# Patient Record
Sex: Female | Born: 1960 | Race: White | Hispanic: No | Marital: Married | Smoking: Former smoker
Health system: Southern US, Community
[De-identification: ages and names within clinical notes are randomized; demographics above are authoritative.]

## PROBLEM LIST (undated history)

## (undated) DIAGNOSIS — F329 Major depressive disorder, single episode, unspecified: Secondary | ICD-10-CM

## (undated) DIAGNOSIS — E039 Hypothyroidism, unspecified: Secondary | ICD-10-CM

## (undated) DIAGNOSIS — F32A Depression, unspecified: Secondary | ICD-10-CM

## (undated) DIAGNOSIS — N393 Stress incontinence (female) (male): Secondary | ICD-10-CM

## (undated) DIAGNOSIS — E079 Disorder of thyroid, unspecified: Secondary | ICD-10-CM

## (undated) HISTORY — PX: COLONOSCOPY: SHX174

## (undated) HISTORY — DX: Disorder of thyroid, unspecified: E07.9

---

## 1987-08-09 HISTORY — PX: THYROIDECTOMY: SHX17

## 2004-06-05 ENCOUNTER — Ambulatory Visit: Payer: Self-pay

## 2004-06-14 ENCOUNTER — Ambulatory Visit: Payer: Self-pay | Admitting: Obstetrics and Gynecology

## 2004-07-14 ENCOUNTER — Ambulatory Visit: Payer: Self-pay | Admitting: Pain Medicine

## 2004-07-28 ENCOUNTER — Ambulatory Visit: Payer: Self-pay | Admitting: Pain Medicine

## 2004-09-02 ENCOUNTER — Ambulatory Visit: Payer: Self-pay | Admitting: Pain Medicine

## 2004-09-13 ENCOUNTER — Ambulatory Visit: Payer: Self-pay | Admitting: Pain Medicine

## 2004-10-19 ENCOUNTER — Ambulatory Visit: Payer: Self-pay | Admitting: Pain Medicine

## 2005-05-25 ENCOUNTER — Encounter: Payer: Self-pay | Admitting: Obstetrics and Gynecology

## 2005-06-08 ENCOUNTER — Encounter: Payer: Self-pay | Admitting: Obstetrics and Gynecology

## 2005-07-08 ENCOUNTER — Encounter: Payer: Self-pay | Admitting: Obstetrics and Gynecology

## 2005-07-12 ENCOUNTER — Ambulatory Visit: Payer: Self-pay | Admitting: Obstetrics and Gynecology

## 2005-07-21 ENCOUNTER — Ambulatory Visit: Payer: Self-pay | Admitting: Obstetrics and Gynecology

## 2006-07-12 ENCOUNTER — Ambulatory Visit: Payer: Self-pay | Admitting: Obstetrics and Gynecology

## 2007-07-30 ENCOUNTER — Ambulatory Visit: Payer: Self-pay | Admitting: Obstetrics and Gynecology

## 2008-08-05 ENCOUNTER — Ambulatory Visit: Payer: Self-pay | Admitting: Obstetrics and Gynecology

## 2009-08-06 ENCOUNTER — Ambulatory Visit: Payer: Self-pay | Admitting: Obstetrics and Gynecology

## 2010-08-16 ENCOUNTER — Ambulatory Visit: Payer: Self-pay | Admitting: Obstetrics and Gynecology

## 2010-08-20 ENCOUNTER — Ambulatory Visit: Payer: Self-pay | Admitting: Obstetrics and Gynecology

## 2011-10-04 ENCOUNTER — Ambulatory Visit: Payer: Self-pay | Admitting: Obstetrics and Gynecology

## 2012-10-04 ENCOUNTER — Ambulatory Visit: Payer: Self-pay | Admitting: Obstetrics and Gynecology

## 2013-07-15 ENCOUNTER — Ambulatory Visit: Payer: Self-pay | Admitting: Unknown Physician Specialty

## 2013-07-16 LAB — PATHOLOGY REPORT

## 2013-10-07 ENCOUNTER — Ambulatory Visit: Payer: Self-pay | Admitting: Obstetrics and Gynecology

## 2014-09-13 ENCOUNTER — Ambulatory Visit: Payer: Self-pay | Admitting: Internal Medicine

## 2014-09-13 LAB — RAPID STREP-A WITH REFLX: MICRO TEXT REPORT: NEGATIVE

## 2014-09-15 LAB — BETA STREP CULTURE(ARMC)

## 2015-08-06 ENCOUNTER — Other Ambulatory Visit: Payer: Self-pay | Admitting: Obstetrics and Gynecology

## 2015-08-06 DIAGNOSIS — Z1231 Encounter for screening mammogram for malignant neoplasm of breast: Secondary | ICD-10-CM

## 2015-08-07 ENCOUNTER — Ambulatory Visit
Admission: RE | Admit: 2015-08-07 | Discharge: 2015-08-07 | Disposition: A | Discharge status: Home / Self Care | Payer: 59 | Source: Ambulatory Visit | Attending: Obstetrics and Gynecology | Admitting: Obstetrics and Gynecology

## 2015-08-07 DIAGNOSIS — Z1231 Encounter for screening mammogram for malignant neoplasm of breast: Secondary | ICD-10-CM | POA: Diagnosis present

## 2016-02-01 IMAGING — MG MM DIGITAL SCREENING BILAT W/ CAD
1 series · 4 of 4 positions shown · non-contrast
Comparison: Previous exam(s).

CLINICAL DATA: Screening.

EXAM:
DIGITAL SCREENING BILATERAL MAMMOGRAM WITH CAD

[R CC · right · 4 of 4 slices shown]
[im 1/4]
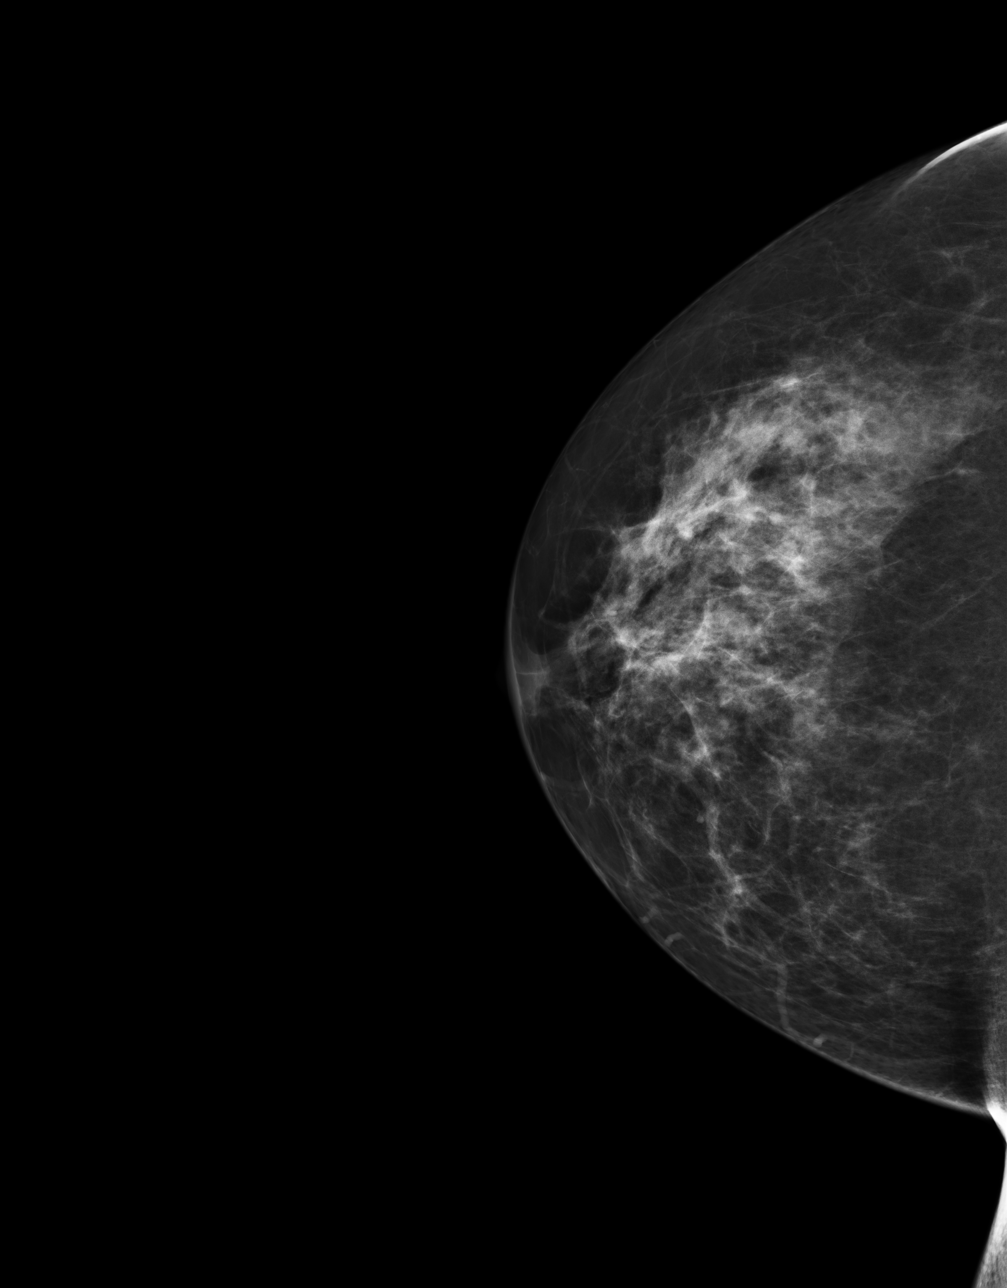
[im 2/4]
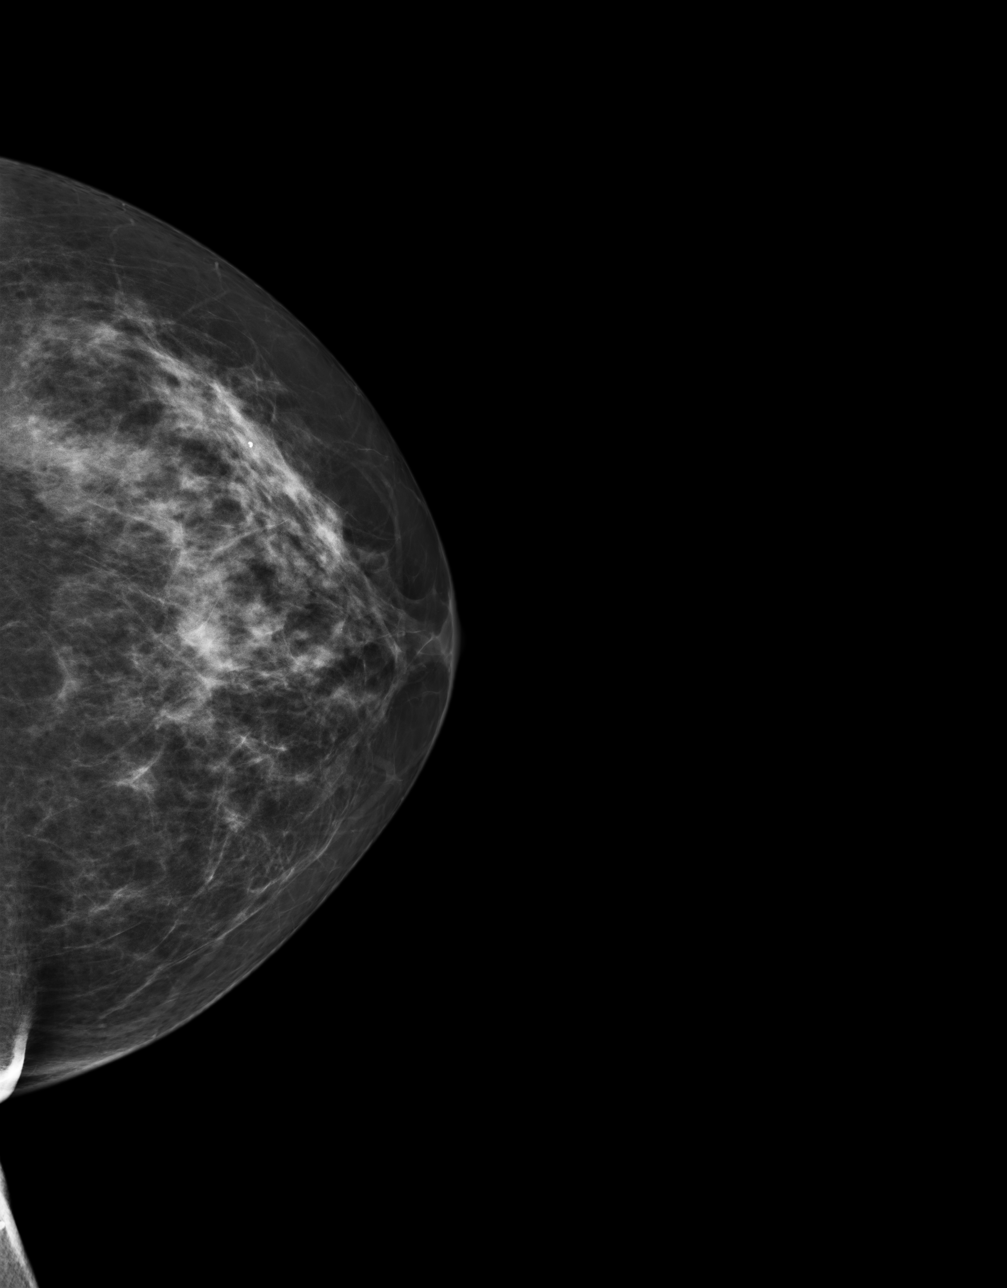
[im 3/4]
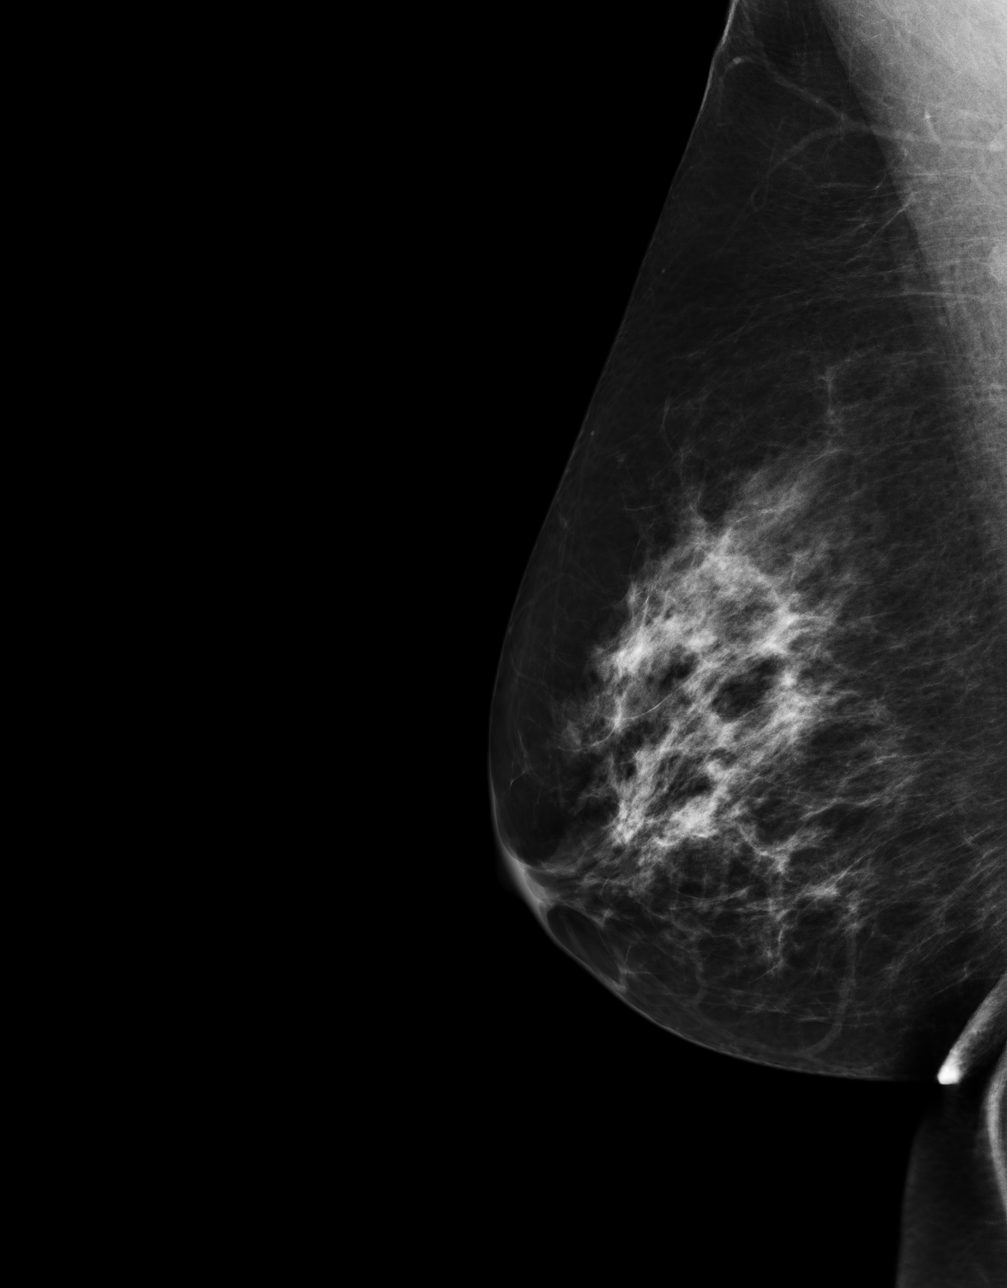
[im 4/4]
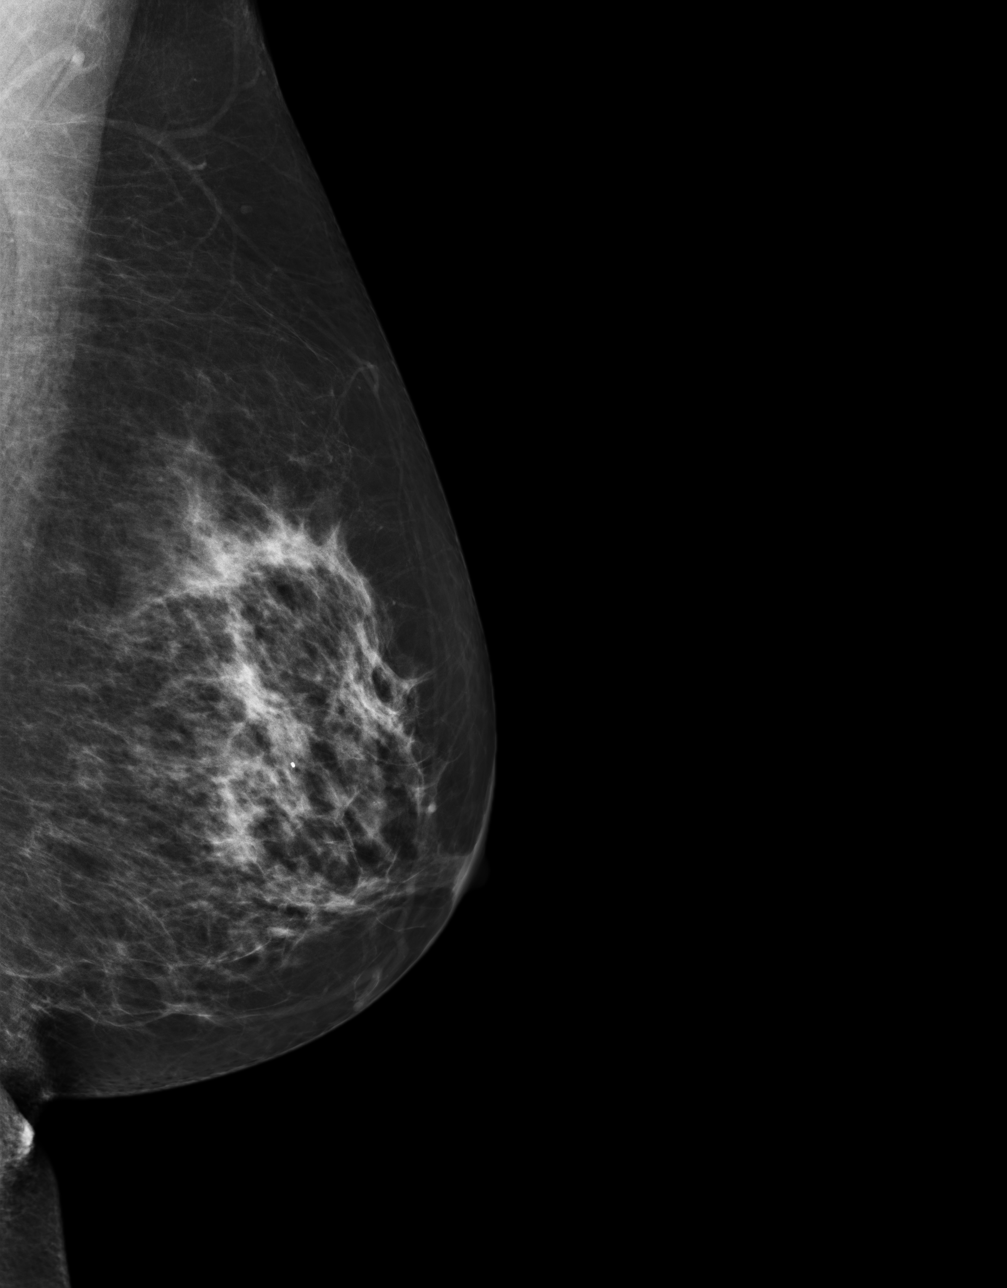

[4 of 4 positions shown; findings below may reference images not displayed]

ACR Breast Density Category c: The breast tissue is heterogeneously
dense, which may obscure small masses.
FINDINGS: There are no findings suspicious for malignancy. Images were
processed with CAD.
IMPRESSION: No mammographic evidence of malignancy. A result letter of this
screening mammogram will be mailed directly to the patient.

RECOMMENDATION:
Screening mammogram in one year. (Code:[id number])

BI-RADS CATEGORY  1: Negative.

## 2017-01-31 NOTE — Progress Notes (Addendum)
02/01/2017 9:50 AM   Abigail West 04/03/1961 [id number]  Referring provider: Schermerhorn, Abigail Austinhomas J, MD [address] [facility] [place], [place] [address]  Chief Complaint  Patient presents with  . Urinary Incontinence    HPI: Patient is a 56 -year-old Caucasian female who is referred to Abigail West by, Abigail West, for urinary incontinence.  Patient states that she has had urinary incontinence for two years.  Patient has incontinence with stress.  She is having urgency, but she denies urge incontinence.  She is experiencing 2 to 3 incontinent episodes during the day. She is experiencing 2 to 3 incontinent episodes during the night.     Her incontinence volume is moderate.   She is wearing 1 to 2 pads/depends daily.    She is having associated urinary frequency x every 30 minutes and nocturia x 2-3.   She does not have a history of urinary tract infections, STI's or injury to the bladder.   She denies dysuria, gross hematuria, suprapubic pain, back pain, abdominal pain or flank pain.  She has not had any recent fevers, chills, nausea or vomiting.   She does not have a history of nephrolithiasis, GU surgery or GU trauma.  She is sexually active.  She has noted incontinence with sexual intercourse.    She is post menopausal.   She denies constipation and/or diarrhea.   She is not having pain with bladder filling.    She has not had any recent imaging studies.    She is drinking a lot of water daily.   She is drinking 3 cups of caffeinated beverages daily.  She is not drinking  alcoholic beverages daily.    Her risk factors for incontinence are a family history of incontinence, age, caffeine, depression and vaginal atrophy.    She is taking antidepressants.      Her UA was unremarkable.  PVR was 0 mL.    PMH: Past Medical History:  Diagnosis Date  . Thyroid disease     Surgical History: Past Surgical History:  Procedure Laterality Date    . THYROIDECTOMY  1989    Home Medications:  Allergies as of 02/01/2017   No Known Allergies     Medication List       Accurate as of 02/01/17 11:59 PM. Always use your most recent med list.          FLUoxetine 20 MG capsule Commonly known as:  PROZAC Take 20 mg by mouth daily.   levothyroxine 88 MCG tablet Commonly known as:  SYNTHROID, LEVOTHROID Take 88 mcg by mouth daily.       Allergies: No Known Allergies  Family History: No family history on file.  Social History:  reports that she quit smoking about 4 years ago. Her smoking use included Cigarettes. She has never used smokeless tobacco. She reports that she drinks about 1.8 oz of alcohol per week . She reports that she does not use drugs.  ROS: UROLOGY Frequent Urination?: Yes Hard to postpone urination?: Yes Burning/pain with urination?: No Get up at night to urinate?: Yes Leakage of urine?: Yes Urine stream starts and stops?: Yes Trouble starting stream?: No Do you have to strain to urinate?: No Blood in urine?: No Urinary tract infection?: No Sexually transmitted disease?: No Injury to kidneys or bladder?: No Painful intercourse?: Yes Weak stream?: No Currently pregnant?: No Vaginal bleeding?: No Last menstrual period?: n  Gastrointestinal Nausea?: No Vomiting?: No Indigestion/heartburn?: No Diarrhea?: No Constipation?:  No  Constitutional Fever: No Night sweats?: No Weight loss?: No Fatigue?: No  Skin Skin rash/lesions?: No Itching?: Yes  Eyes Blurred vision?: No Double vision?: No  Ears/Nose/Throat Sore throat?: No Sinus problems?: No  Hematologic/Lymphatic Swollen glands?: No Easy bruising?: No  Cardiovascular Leg swelling?: No Chest pain?: No  Respiratory Cough?: No Shortness of breath?: No  Endocrine Excessive thirst?: No  Musculoskeletal Back pain?: No Joint pain?: No  Neurological Headaches?: No Dizziness?: No  Psychologic Depression?: No Anxiety?:  No  Physical Exam: BP 111/72   Pulse 71   Ht 5\' 4"  (1.626 m)   Wt 174 lb (78.9 kg)   BMI 29.87 kg/m   Constitutional: Well nourished. Alert and oriented, No acute distress. HEENT: [place] AT, moist mucus membranes. Trachea midline, no masses. Cardiovascular: No clubbing, cyanosis, or edema. Respiratory: Normal respiratory effort, no increased work of breathing. GI: Abdomen is soft, non tender, non distended, no abdominal masses. Liver and spleen not palpable.  No hernias appreciated.  Stool sample for occult testing is not indicated.   GU: No CVA tenderness.  No bladder fullness or masses.  Atrophic external genitalia, normal pubic hair distribution, no lesions.  Normal urethral meatus, no lesions, no prolapse, no discharge.   No urethral masses, tenderness and/or tenderness. No bladder fullness, tenderness or masses. Pale vagina mucosa, poor estrogen effect, no discharge, no lesions, good pelvic support, Grade II cystocele.  No rectocele noted.  No cervical motion tenderness.  Uterus is freely mobile and non-fixed.  No adnexal/parametria masses or tenderness noted.  Anus and perineum are without rashes or lesions.    Skin: No rashes, bruises or suspicious lesions. Lymph: No cervical or inguinal adenopathy. Neurologic: Grossly intact, no focal deficits, moving all 4 extremities. Psychiatric: Normal mood and affect.   Pertinent Imaging: Results for Abigail West, Abigail West (MRN [id number]) as of 02/02/2017 14:22  Ref. Range 02/01/2017 15:33  Scan Result Unknown 0    Assessment & Plan:    1. Stress Incontinence  - offered behavioral therapies, bladder training and bladder control strategies - patient would prefer a more definitive treatment plan  - pelvic floor muscle training - patient deferred  - fluid management - reduce caffeine   - offered refer to gynecology for a pessary fitting - patient deferred  - offered an appointment with one of our surgeon for a possible pelvic sling procedure -  schedule an appointment with Dr. Sherron Monday - patient advised that there is an extensive work up to see if she is a suitable candidate for a sling   2. Urgency  - offered medical therapy with anticholinergic therapy or beta-3 adrenergic receptor agonist and the potential side effects of each therapy   - would like to try the beta-3 adrenergic receptor agonist (Myrbetriq).  Given Myrbetriq 25 mg samples, #28.  I have reviewed with the patient of the side effects of Myrbetriq, such as: elevation in BP, urinary retention and/or HA.    - She will return for an appointment with Dr. Sherron Monday  3. Vaginal atrophy  - Patient was given a sample of vaginal estrogen cream (Premarin) and instructed to apply 0.5mg  (pea-sized amount)  just inside the vaginal introitus with a finger-tip every night for two weeks and then Monday, Wednesday and Friday nights.  I explained to the patient that vaginally administered estrogen, which causes only a slight increase in the blood estrogen levels, have fewer contraindications and adverse systemic effects that oral HT.   Return for appointment with Dr. Sherron Monday.  These notes generated with voice recognition software. I apologize for typographical errors.  Zara Council, [facility] [address], [place], [place] [address] [phone]

## 2017-02-01 ENCOUNTER — Encounter: Payer: Self-pay | Admitting: Urology

## 2017-02-01 ENCOUNTER — Ambulatory Visit (INDEPENDENT_AMBULATORY_CARE_PROVIDER_SITE_OTHER): Payer: 59 | Admitting: Urology

## 2017-02-01 VITALS — BP 111/72 | HR 71 | Ht 64.0 in | Wt 174.0 lb

## 2017-02-01 DIAGNOSIS — N393 Stress incontinence (female) (male): Secondary | ICD-10-CM

## 2017-02-01 DIAGNOSIS — R3915 Urgency of urination: Secondary | ICD-10-CM | POA: Diagnosis not present

## 2017-02-01 DIAGNOSIS — N952 Postmenopausal atrophic vaginitis: Secondary | ICD-10-CM | POA: Diagnosis not present

## 2017-02-01 LAB — BLADDER SCAN AMB NON-IMAGING: Scan Result: 0

## 2017-02-02 LAB — URINALYSIS, COMPLETE
BILIRUBIN UA: NEGATIVE
GLUCOSE, UA: NEGATIVE
KETONES UA: NEGATIVE
Leukocytes, UA: NEGATIVE
Nitrite, UA: NEGATIVE
Protein, UA: NEGATIVE
RBC, UA: NEGATIVE
Specific Gravity, UA: 1.02 (ref 1.005–1.030)
UUROB: 0.2 mg/dL (ref 0.2–1.0)
pH, UA: 6.5 (ref 5.0–7.5)

## 2017-03-01 ENCOUNTER — Other Ambulatory Visit: Payer: Self-pay | Admitting: Family Medicine

## 2017-03-01 DIAGNOSIS — Z1231 Encounter for screening mammogram for malignant neoplasm of breast: Secondary | ICD-10-CM

## 2017-03-06 ENCOUNTER — Ambulatory Visit: Payer: 59 | Admitting: Urology

## 2017-03-06 ENCOUNTER — Encounter: Payer: Self-pay | Admitting: Urology

## 2017-03-06 VITALS — BP 142/76 | HR 62 | Ht 64.0 in | Wt 178.8 lb

## 2017-03-06 DIAGNOSIS — N3946 Mixed incontinence: Secondary | ICD-10-CM

## 2017-03-06 NOTE — Progress Notes (Signed)
03/06/2017 4:09 PM   Abigail West 01/28/1961 [id number]  Referring provider: Schermerhorn, Ihor Austinhomas J, MD [address] [facility] [place], [place] [id number]  Chief Complaint  Patient presents with  . Urinary Incontinence    HPI: Abigail HerterShannon: The patient was having urgency but no urge incontinence but also stress incontinence she was leaking day and night and wearing one to 2 pads a day. It was noted she was voiding every 30 minutes and getting up 2-3 times at night. Her residual was 0 mL. She was noted to have a grade 2 cystocele. She was started on estrogen cream and her compliance has been less than ideal. She was given the beta 3 agonists and 25 mg and behavioral therapy  Today Currently the patient's primary problem is leaking with coughing sneezing bending and lifting and with exercise. She reports perhaps rare urge incontinence. She does not have bedwetting. She gets up once at night and voids every 1 hour due to some suprapubic pressure. It is relieved by voiding. She cannot hold urination for 2 hours.  Her flow was good and she does feel empty.  She believes that she has less frequency and urgency with the beta 3 agonists but is still leaking the same  She has not had bladder surgery or hysterectomy.  Modifying factors: There are no other modifying factors  Associated signs and symptoms: There are no other associated signs and symptoms Aggravating and relieving factors: There are no other aggravating or relieving factors Severity: Moderate Duration: Persistent   PMH: Past Medical History:  Diagnosis Date  . Thyroid disease     Surgical History: Past Surgical History:  Procedure Laterality Date  . THYROIDECTOMY  1989    Home Medications:  Allergies as of 03/06/2017   No Known Allergies     Medication List       Accurate as of 03/06/17  4:09 PM. Always use your most recent med list.          FLUoxetine 20 MG capsule Commonly  known as:  PROZAC Take 20 mg by mouth daily.   levothyroxine 88 MCG tablet Commonly known as:  SYNTHROID, LEVOTHROID Take 88 mcg by mouth daily.   MYRBETRIQ 25 MG Tb24 tablet Generic drug:  mirabegron ER Take 25 mg by mouth daily.       Allergies: No Known Allergies  Family History: History reviewed. No pertinent family history.  Social History:  reports that she quit smoking about 4 years ago. Her smoking use included Cigarettes. She has never used smokeless tobacco. She reports that she drinks about 1.8 oz of alcohol per week . She reports that she does not use drugs.  ROS: UROLOGY Frequent Urination?: Yes Hard to postpone urination?: Yes Burning/pain with urination?: No Get up at night to urinate?: Yes Leakage of urine?: Yes Urine stream starts and stops?: No Trouble starting stream?: No Do you have to strain to urinate?: No Blood in urine?: No Urinary tract infection?: No Sexually transmitted disease?: No Injury to kidneys or bladder?: No Painful intercourse?: Yes Weak stream?: No Currently pregnant?: No Vaginal bleeding?: No Last menstrual period?: n  Gastrointestinal Nausea?: No Vomiting?: No Indigestion/heartburn?: No Diarrhea?: No Constipation?: No  Constitutional Fever: No Night sweats?: No Weight loss?: No Fatigue?: No  Skin Skin rash/lesions?: No Itching?: No  Eyes Blurred vision?: No Double vision?: No  Ears/Nose/Throat Sore throat?: No Sinus problems?: No  Hematologic/Lymphatic Swollen glands?: No Easy bruising?: No  Cardiovascular Leg swelling?: No Chest pain?:  No  Respiratory Cough?: No Shortness of breath?: No  Endocrine Excessive thirst?: No  Musculoskeletal Back pain?: No Joint pain?: No  Neurological Headaches?: No Dizziness?: No  Psychologic Depression?: No Anxiety?: No  Physical Exam: BP (!) 142/76 (BP Location: Left Arm, Patient Position: Sitting, Cuff Size: Normal)   Pulse 62   Ht 5\' 4"  (1.626 m)    Wt 178 lb 12.8 oz (81.1 kg)   BMI 30.69 kg/m   Constitutional:  Alert and oriented, No acute distress. HEENT: [place] AT, moist mucus membranes.  Trachea midline, no masses. Cardiovascular: No clubbing, cyanosis, or edema. Respiratory: Normal respiratory effort, no increased work of breathing. 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 Grade 1 cystocele with a little bit of rotational descent. She is not at risk of a hinge effect. She did not leak Skin: No rashes, bruises or suspicious lesions. Lymph: No cervical or inguinal adenopathy. Neurologic: Grossly intact, no focal deficits, moving all 4 extremities. Psychiatric: Normal mood and affect.  Laboratory Data:  Urinalysis    Component Value Date/Time   APPEARANCEUR Clear 02/01/2017 1529   GLUCOSEU Negative 02/01/2017 1529   BILIRUBINUR Negative 02/01/2017 1529   PROTEINUR Negative 02/01/2017 1529   NITRITE Negative 02/01/2017 1529   LEUKOCYTESUR Negative 02/01/2017 1529    Pertinent Imaging: None  Assessment & Plan:  The patient has mixed incontinence but primarily stress incontinence. She does have significant frequency. She does have some suprapubic pressure. My index of suspicion is low that she is interstitial cystitis but evidence of such would be look bad at the time of urodynamics.  There are no diagnoses linked to this encounter.  No Follow-up on file.  Martina SinnerMACDIARMID,Zacari Radick A, MD  [facility] [address], [address] [place], [place] [address] [phone]

## 2017-03-07 ENCOUNTER — Encounter (INDEPENDENT_AMBULATORY_CARE_PROVIDER_SITE_OTHER): Payer: Self-pay

## 2017-03-07 ENCOUNTER — Ambulatory Visit
Admission: RE | Admit: 2017-03-07 | Discharge: 2017-03-07 | Disposition: A | Discharge status: Home / Self Care | Payer: 59 | Source: Ambulatory Visit | Attending: Family Medicine | Admitting: Family Medicine

## 2017-03-07 DIAGNOSIS — Z1231 Encounter for screening mammogram for malignant neoplasm of breast: Secondary | ICD-10-CM | POA: Diagnosis present

## 2017-04-04 ENCOUNTER — Other Ambulatory Visit: Payer: Self-pay | Admitting: Urology

## 2017-04-19 ENCOUNTER — Ambulatory Visit (INDEPENDENT_AMBULATORY_CARE_PROVIDER_SITE_OTHER): Payer: 59 | Admitting: Urology

## 2017-04-19 DIAGNOSIS — N3946 Mixed incontinence: Secondary | ICD-10-CM | POA: Diagnosis not present

## 2017-04-19 NOTE — Progress Notes (Signed)
04/19/2017 3:48 PM   Javionna Minor Sherald BargeWilborn 12/29/1960 [id number]  Referring provider: Schermerhorn, Ihor Austinhomas J, MD [address] [facility] [place], [place] [id number]  No chief complaint on file.   HPI: Carollee HerterShannon: The patient was having urgency but no urge incontinence but also stress incontinence she was leaking day and night and wearing one to 2 pads a day. It was noted she was voiding every 30 minutes and getting up 2-3 times at night. Her residual was 0 mL. She was noted to have a grade 2 cystocele. She was started on estrogen cream and her compliance has been less than ideal. She was given the beta 3 agonists and 25 mg and behavioral therapy  Today Currently the patient's primary problem is leaking with coughing sneezing bending and lifting and with exercise. She reports perhaps rare urge incontinence. She does not have bedwetting. She gets up once at night and voids every 1 hour due to some suprapubic pressure. It is relieved by voiding. She cannot hold urination for 2 hours.  Her flow was good and she does feel empty.  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 Grade 1 cystocele with a little bit of rotational descent. She is not at risk of a hinge effect. She did not leak  The patient has mixed incontinence but primarily stress incontinence. She does have significant frequency. She does have some suprapubic pressure. My index of suspicion is low that she is interstitial cystitis but evidence of such would be looled at the time of urodynamics.  Today Frequency and incontinence are stable the patient empty efficiently. Bladder capacity was 637 mL. Bladder was stable. She felt some pressure but no bladder pain. At 200 mL she leaked a mild amount at 85 cm of water. At 400 mL she leaked a mild amount of 52 cm water. She was tensing her muscles try not to leak  and was coached. Her cough leak point pressure 500 mL was 56 cm water there was mild to moderate. During voiding she voided 420 mL. Max flow was 24 mils per second. Voiding pressure was 19 cm water. She did not empty efficiently. EMG activity increased some during the voiding phase. She did have trouble voiding in the lab setting. Bladder neck descended about 2 cm.  I repeated the patient's history. She can do with the incontinence with coughing sneezing but she does leak a lot limiting exercising. I don't think she's tried any other medication reviewed her medical record and she was not sure if she ever tried [employer] systems [employer]esicare. Again she didn't know how long she stayed on the mirror better 25 mg. She was helping her frequency.  She does have significant dyspareunia. She does admit an uncomfortable feeling in her pressure in the suprapubic area but it is somewhat nonspecific and mild or     PMH: Past Medical History:  Diagnosis Date  . Thyroid disease     Surgical History: Past Surgical History:  Procedure Laterality Date  . THYROIDECTOMY  1989    Home Medications:  Allergies as of 04/19/2017   No Known Allergies     Medication List       Accurate as of 04/19/17  3:48 PM. Always use your most recent med list.          FLUoxetine 20 MG capsule Commonly known as:  PROZAC Take 20 mg by  mouth daily.   levothyroxine 88 MCG tablet Commonly known as:  SYNTHROID, LEVOTHROID Take 88 mcg by mouth daily.       Allergies: No Known Allergies  Family History: Family History  Problem Relation Age of Onset  . Breast cancer Neg Hx     Social History:  reports that she quit smoking about 4 years ago. Her smoking use included Cigarettes. She has never used smokeless tobacco. She reports that she drinks about 1.8 oz of alcohol per week . She reports that she does not use drugs.  ROS: UROLOGY Frequent Urination?: Yes Hard to postpone urination?: No Burning/pain with urination?: No Get up  at night to urinate?: Yes Leakage of urine?: Yes Urine stream starts and stops?: No Trouble starting stream?: No Do you have to strain to urinate?: No Blood in urine?: No Urinary tract infection?: No Sexually transmitted disease?: No Injury to kidneys or bladder?: No Painful intercourse?: Yes Weak stream?: No Currently pregnant?: No Vaginal bleeding?: No Last menstrual period?: n  Gastrointestinal Nausea?: No Vomiting?: No Indigestion/heartburn?: No Diarrhea?: No Constipation?: No  Constitutional Fever: No Night sweats?: No Weight loss?: No Fatigue?: No  Skin Skin rash/lesions?: No Itching?: No  Eyes Blurred vision?: No Double vision?: No  Ears/Nose/Throat Sore throat?: No Sinus problems?: No  Hematologic/Lymphatic Swollen glands?: No Easy bruising?: No  Cardiovascular Leg swelling?: No Chest pain?: No  Respiratory Cough?: No Shortness of breath?: No  Endocrine Excessive thirst?: No  Musculoskeletal Back pain?: No Joint pain?: No  Neurological Headaches?: No Dizziness?: No  Psychologic Depression?: No Anxiety?: No  Physical Exam: There were no vitals taken for this visit.  Constitutional:  Alert and oriented, No acute distress.  Laboratory Data:  Urinalysis    Component Value Date/Time   APPEARANCEUR Clear 02/01/2017 1529   GLUCOSEU Negative 02/01/2017 1529   BILIRUBINUR Negative 02/01/2017 1529   PROTEINUR Negative 02/01/2017 1529   NITRITE Negative 02/01/2017 1529   LEUKOCYTESUR Negative 02/01/2017 1529    Pertinent Imaging: none  Assessment & Plan: The patient does have stress incontinence affecting her quality of life. She is bothered her a lot by her hourly frequency affecting her quality life and has vague pelvic symptoms. The role of a hydrodistention with my usual template was discussed. My index of suspicion is lower but she could have interstitial cystitis and she is aware that surgery could unmask or make her symptoms  worse. She would also need to be warned of more issues with potentially retention post procedure. I encouraged behavioral and physical therapy and a trial of beta 3 agonist 50 mg and Vesicare 5 mg. I decided to treat her with double therapy with free samples. If she does very well I will stop one of them.  A hydrodistention may or may not be needed in the future. I usually like to do this prior to slings. The potential inconvenience of this was discussed today.       There are no diagnoses linked to this encounter.  No Follow-up on file.  Martina Sinner, MD  [facility] [address] [place], [place] [address] [phone]

## 2017-05-31 ENCOUNTER — Ambulatory Visit: Payer: 59 | Admitting: Urology

## 2017-05-31 ENCOUNTER — Encounter: Payer: Self-pay | Admitting: Urology

## 2017-05-31 VITALS — BP 105/71 | HR 86 | Ht 64.0 in | Wt 175.1 lb

## 2017-05-31 DIAGNOSIS — N3946 Mixed incontinence: Secondary | ICD-10-CM

## 2017-05-31 NOTE — Progress Notes (Signed)
05/31/2017 4:33 PM   Litha Minor Sherald BargeWilborn 09/04/1960 [id number]  Referring provider: Schermerhorn, Ihor Austinhomas J, MD [address] [facility] [place], [place] [id number]  Chief Complaint  Patient presents with  . Urinary Incontinence    HPI: HPI: Carollee HerterShannon: The patient was having urgency but no urge incontinence but also stress incontinence she was leaking day and night and wearing one to 2 pads a day. It was noted she was voiding every 30 minutes and getting up 2-3 times at night. Her residual was 0 mL. She was noted to have a grade 2 cystocele. She was started on estrogen cream and her compliance has been less than ideal. She was given the beta 3 agonists and 25 mg and behavioral therapy  Today Currently the patient's primary problem is leaking with coughing sneezing bending and lifting and with exercise. She reports perhaps rare urge incontinence. She does not have bedwetting. She gets up once at night and voids every 1 hour due to some suprapubic pressure. It is relieved by voiding. She cannot hold urination for 2 hours.  Her flow was good and she does feel empty.  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 Grade 1 cystocele with a little bit of rotational descent. She is not at risk of a hinge effect. She did not leak  The patient has mixed incontinence but primarily stress incontinence. She does have significant frequency. She does have some suprapubic pressure. My index of suspicion is low that she is interstitial cystitis but evidence of such would be looled at the time of urodynamics.  Bladder capacity was 637 mL. Bladder was stable. She felt some pressure but no bladder pain. At 200 mL she leaked a mild amount at 85 cm of water. At 400 mL she leaked a mild amount of 52 cm water. She was tensing her muscles try not to leak and was coached. Her cough  leak point pressure 500 mL was 56 cm water there was mild to moderate. During voiding she voided 420 mL. Max flow was 24 mils per second. Voiding pressure was 19 cm water. She did not empty efficiently. EMG activity increased some during the voiding phase. She did have trouble voiding in the lab setting. Bladder neck descended about 2 cm.  I repeated the patient's history. She can do with the incontinence with coughing sneezing but she does leak a lot limiting exercising. I don't think she's tried any other medication reviewed her medical record and she was not sure if she ever tried [employer] [facility] [employer]. Again she didn't know how long she stayed on the B3 25 mg. She was helping her frequency.  She does have significant dyspareunia. She does admit an uncomfortable feeling in her pressure in the suprapubic area but it is somewhat nonspecific and mild or  The patient does have stress incontinence affecting her quality of life. She is bothered her a lot by her hourly frequency affecting her quality life and has vague pelvic symptoms. The role of a hydrodistention with my usual template was discussed. My index of suspicion is lower but she could have interstitial cystitis and she is aware that surgery could unmask or make her symptoms worse. She would also need to be warned of more issues with potentially retention post procedure. I encouraged behavioral and physical therapy and a trial of beta 3 agonist 50 mg and Vesicare 5  mg. I decided to treat her with double therapy with free samples. If she does very well I will stop one of them.  A hydrodistention may or may not be needed in the future. I usually like to do this prior to slings. The potential inconvenience of this was discussed today.  Today   PMH: Past Medical History:  Diagnosis Date  . Thyroid disease     Surgical History: Past Surgical History:  Procedure Laterality Date  . THYROIDECTOMY  1989    Home Medications:  Allergies as of 05/31/2017    No Known Allergies     Medication List       Accurate as of 05/31/17  4:33 PM. Always use your most recent med list.          FLUoxetine 20 MG capsule Commonly known as:  PROZAC Take 20 mg by mouth daily.   levothyroxine 88 MCG tablet Commonly known as:  SYNTHROID, LEVOTHROID Take 88 mcg by mouth daily.   VESICARE 10 MG tablet Generic drug:  solifenacin Take by mouth.       Allergies: No Known Allergies  Family History: Family History  Problem Relation Age of Onset  . Breast cancer Neg Hx     Social History:  reports that she quit smoking about 4 years ago. Her smoking use included Cigarettes. She has never used smokeless tobacco. She reports that she drinks about 1.8 oz of alcohol per week . She reports that she does not use drugs.  ROS: UROLOGY Frequent Urination?: Yes Hard to postpone urination?: Yes Burning/pain with urination?: No Get up at night to urinate?: Yes Leakage of urine?: Yes Urine stream starts and stops?: No Trouble starting stream?: No Do you have to strain to urinate?: No Blood in urine?: No Urinary tract infection?: No Sexually transmitted disease?: No Injury to kidneys or bladder?: No Painful intercourse?: No Weak stream?: No Currently pregnant?: No Vaginal bleeding?: No Last menstrual period?: n  Gastrointestinal Nausea?: No Vomiting?: No Indigestion/heartburn?: No Diarrhea?: No Constipation?: No  Constitutional Fever: No Night sweats?: No Weight loss?: No Fatigue?: No  Skin Skin rash/lesions?: No Itching?: No  Eyes Blurred vision?: No Double vision?: No  Ears/Nose/Throat Sore throat?: No Sinus problems?: No  Hematologic/Lymphatic Swollen glands?: No Easy bruising?: No  Cardiovascular Leg swelling?: No Chest pain?: No  Respiratory Cough?: No Shortness of breath?: No  Endocrine Excessive thirst?: No  Musculoskeletal Back pain?: No Joint pain?: No  Neurological Headaches?: No Dizziness?:  No  Psychologic Depression?: No Anxiety?: No  Physical Exam: BP 105/71 (BP Location: Right Arm, Patient Position: Sitting, Cuff Size: Normal)   Pulse 86   Ht 5\' 4"  (1.626 m)   Wt 175 lb 1.6 oz (79.4 kg)   BMI 30.06 kg/m     Laboratory Data: No results found for: WBC, HGB, HCT, MCV, PLT  No results found for: CREATININE  No results found for: PSA  No results found for: TESTOSTERONE  No results found for: HGBA1C  Urinalysis    Component Value Date/Time   APPEARANCEUR Clear 02/01/2017 1529   GLUCOSEU Negative 02/01/2017 1529   BILIRUBINUR Negative 02/01/2017 1529   PROTEINUR Negative 02/01/2017 1529   NITRITE Negative 02/01/2017 1529   LEUKOCYTESUR Negative 02/01/2017 1529    Pertinent Imaging: none  Assessment & Plan: The patient still has vague pelvic sensations.  She can deal with the frequency.  Cold weather aggravate some of her symptoms.  She has only been on Vesicare for 3 days and never took  double therapy.  The beta 3 agonist helped minimally.  She may have had joint pain.  I will see her in a month and go over surgery in detail.  I mentioned a hydrodistention of regulation of pain.  I think would be reasonable to go ahead with a sling for her activity related stress incontinence and warned her about pain syndromes afterwards.  I tend not to want to hide her to stand at the time of the sling.  Treatment goals will need to be emphasized  There are no diagnoses linked to this encounter.  No Follow-up on file.  Martina Sinner, MD  [facility] [address] [place], [place] [address] [phone]

## 2017-07-03 ENCOUNTER — Ambulatory Visit (INDEPENDENT_AMBULATORY_CARE_PROVIDER_SITE_OTHER): Payer: 59 | Admitting: Urology

## 2017-07-03 ENCOUNTER — Encounter: Payer: Self-pay | Admitting: Urology

## 2017-07-03 VITALS — BP 109/75 | HR 72 | Ht 64.0 in | Wt 182.9 lb

## 2017-07-03 DIAGNOSIS — N3946 Mixed incontinence: Secondary | ICD-10-CM | POA: Diagnosis not present

## 2017-07-03 NOTE — Progress Notes (Signed)
07/03/2017 3:56 PM   Wyonia Minor Sherald BargeWilborn 08/30/1960 [id number]  Referring provider: Schermerhorn, Ihor Austinhomas J, MD [address] [facility] [place], [place] [address]  Chief Complaint  Patient presents with  . Urinary Incontinence    HPI: Carollee HerterShannon: The patient was having urgency but no urge incontinence but also stress incontinence she was leaking day and night and wearing one to 2 pads a day. It was noted she was voiding every 30 minutes and getting up 2-3 times at night. Her residual was 0 mL. She was noted to have a grade 2 cystocele. She was started on estrogen cream and her compliance has been less than ideal. She was given the beta 3 agonists and 25 mg and behavioral therapy  Today Currently the patient's primary problem is leaking with coughing sneezing bending and lifting and with exercise. She reports perhaps rare urge incontinence. She does not have bedwetting. She gets up once at night and voids every 1 hour due to some suprapubic pressure. It is relieved by voiding. She cannot hold urination for 2 hours.  Her flow was good and she does feel empty.  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 Grade 1 cystocele with a little bit of rotational descent. She is not at risk of a hinge effect. She did not leak  The patient has mixed incontinence but primarily stress incontinence. She does have significant frequency. She does have some suprapubic pressure. My index of suspicion is low that she is interstitial cystitis but evidence of such would be looledat the time of urodynamics.  Bladder capacity was 637 mL. Bladder was stable. She felt some pressure but no bladder pain. At 200 mL she leaked a mild amount at 85 cm of water. At 400 mL she leaked a mild amount of 52 cm water. She was tensing her muscles try not to leak and was coached. Her cough leak  point pressure 500 mL was 56 cm water there was mild to moderate. During voiding she voided 420 mL. Max flow was 24 mils per second. Voiding pressure was 19 cm water. She did not empty efficiently. EMG activity increased some during the voiding phase. She did have trouble voiding in the lab setting. Bladder neck descended about 2 cm.  I repeated the patient's history. She can deal with the incontinence with coughing sneezing but she does leak a lot limiting exercising. I don't think she's tried any other medication reviewed her medical record and she was not sure if she ever tried [employer]. Again she didn't know how long she stayed on the B3 25 mg. She was helping her frequency.  She does have significant dyspareunia. She does admit an uncomfortable feeling in her pressure in the suprapubic area but it is somewhat nonspecific and milder  The patient does have stress incontinence affecting her quality of life. She is bothered her a lot by her hourly frequency affecting her quality life and has vague pelvic symptoms. The role of a hydrodistention with my usual template was discussed. My index of suspicion is lower but she could have interstitial cystitis and she is aware that surgery could unmask or make her symptoms worse. She would also need to be warned of more issues with potentially retention post procedure. I encouraged behavioral and physical therapy and a trial of beta 3 agonist 50 mg and Vesicare 5 mg. I decided  to treat her with double therapy with free samples. If she does very well I will stop one of them.  A hydrodistention may or may not be needed in the future. I usually like to do this prior to slings. The potentialinconvenience of this was discussed today.  Today The patient still has incontinence.  She took the medications independent of one another and they both helped some.  She still has some pressure and dyspareunia.  I drew her a picture.  We talked about a sling versus  medication and she does not want to see the physical therapist.  She can live with the frequency and nocturia and the coughing and sneezing leaking but really wants to exercise and describes double padding  She understands that she could have interstitial cystitis but my index of suspicion is on the lower side and she understands I do not like hydrodistended at the time of the sling recognizing the imperfections of this test and others and firming up the diagnosis.  This was discussed  My usual template was discussed.  Mesh issues discussed.  Upper regulation of pain syndrome and higher retention rates from pelvic floor dyssynergia discussed.   PMH: Past Medical History:  Diagnosis Date  . Thyroid disease     Surgical History: Past Surgical History:  Procedure Laterality Date  . THYROIDECTOMY  1989    Home Medications:  Allergies as of 07/03/2017   No Known Allergies     Medication List        Accurate as of 07/03/17  3:56 PM. Always use your most recent med list.          FLUoxetine 20 MG capsule Commonly known as:  PROZAC Take 20 mg by mouth daily.   levothyroxine 88 MCG tablet Commonly known as:  SYNTHROID, LEVOTHROID Take 88 mcg by mouth daily.   VESICARE 10 MG tablet Generic drug:  solifenacin Take by mouth.       Allergies: No Known Allergies  Family History: Family History  Problem Relation Age of Onset  . Breast cancer Neg Hx     Social History:  reports that she quit smoking about 4 years ago. Her smoking use included cigarettes. she has never used smokeless tobacco. She reports that she drinks about 1.8 oz of alcohol per week. She reports that she does not use drugs.  ROS: UROLOGY Frequent Urination?: Yes Hard to postpone urination?: No Burning/pain with urination?: No Get up at night to urinate?: Yes Leakage of urine?: No Urine stream starts and stops?: Yes Trouble starting stream?: No Do you have to strain to urinate?: No Blood in urine?:  No Urinary tract infection?: No Sexually transmitted disease?: No Injury to kidneys or bladder?: No Painful intercourse?: Yes Weak stream?: No Currently pregnant?: No Vaginal bleeding?: No Last menstrual period?: n  Gastrointestinal Nausea?: No Vomiting?: No Indigestion/heartburn?: No Diarrhea?: No Constipation?: No  Constitutional Fever: No Night sweats?: No Weight loss?: No Fatigue?: No  Skin Skin rash/lesions?: No Itching?: No  Eyes Blurred vision?: No Double vision?: No  Ears/Nose/Throat Sore throat?: No Sinus problems?: No  Hematologic/Lymphatic Swollen glands?: No Easy bruising?: No  Cardiovascular Leg swelling?: No Chest pain?: No  Respiratory Cough?: No Shortness of breath?: No     Musculoskeletal Back pain?: No Joint pain?: No  Neurological Headaches?: No Dizziness?: No  Psychologic Depression?: No Anxiety?: No  Physical Exam: BP 109/75 (BP Location: Right Arm, Patient Position: Sitting, Cuff Size: Large)   Pulse 72   Ht 5\' 4"  (  1.626 m)   Wt 182 lb 14.4 oz (83 kg)   BMI 31.39 kg/m   Constitutional:  Alert and oriented, No acute distress.   Laboratory Data: No results found for: WBC, HGB, HCT, MCV, PLT  No results found for: CREATININE  No results found for: PSA  No results found for: TESTOSTERONE  No results found for: HGBA1C  Urinalysis    Component Value Date/Time   APPEARANCEUR Clear 02/01/2017 1529   GLUCOSEU Negative 02/01/2017 1529   BILIRUBINUR Negative 02/01/2017 1529   PROTEINUR Negative 02/01/2017 1529   NITRITE Negative 02/01/2017 1529   LEUKOCYTESUR Negative 02/01/2017 1529    Pertinent Imaging: None   Assessment & Plan: I think the patient is making a good decision.  It is a difficult one for her.  I certainly can empathize with her and hopefully she will reach her treatment goal with a sling.  We will arrange this  There are no diagnoses linked to this encounter.  No Follow-up on  file.  Martina SinnerMACDIARMID,Rosalene Wardrop A, MD  [facility] [address] [place], [place] [address] [phone]

## 2017-07-04 ENCOUNTER — Other Ambulatory Visit: Payer: Self-pay | Admitting: Radiology

## 2017-07-04 DIAGNOSIS — N393 Stress incontinence (female) (male): Secondary | ICD-10-CM

## 2017-07-10 ENCOUNTER — Telehealth: Payer: Self-pay | Admitting: Radiology

## 2017-07-10 NOTE — Telephone Encounter (Signed)
LMOM to notify pt of pre-admit testing appt, CIC teaching appt & post op appt with Dr Sherron MondayMacDiarmid. Requested call back to confirm.

## 2017-07-12 NOTE — Telephone Encounter (Signed)
Pt called to confirm appts for pre-admit testing appt, CIC teaching appt & post op appt with Dr Sherron MondayMacDiarmid. Questions answered. Pt voices understanding.  Pt asks what is recovery time for a pubo-vaginal sling placement? Please advise.

## 2017-07-12 NOTE — Telephone Encounter (Signed)
NO heavy straining and lifting for 4 wqeeks I reviewed this with patient

## 2017-07-12 NOTE — Telephone Encounter (Signed)
Notified pt of no heavy straining and lifting for 4 weeks per Dr Sherron MondayMacDiarmid. Pt voices understanding.

## 2017-08-09 ENCOUNTER — Telehealth: Payer: Self-pay | Admitting: Radiology

## 2017-08-09 NOTE — Telephone Encounter (Signed)
LMOM. Has insurance changed? [facility] is no [facility] covered after 08/07/17.

## 2017-08-10 NOTE — Telephone Encounter (Signed)
Pt now has [employer]. Will bring card to appt on 08/14/2017.

## 2017-08-14 ENCOUNTER — Ambulatory Visit (INDEPENDENT_AMBULATORY_CARE_PROVIDER_SITE_OTHER): Payer: Managed Care, Other (non HMO)

## 2017-08-14 ENCOUNTER — Other Ambulatory Visit: Payer: Self-pay

## 2017-08-14 ENCOUNTER — Encounter
Admission: RE | Admit: 2017-08-14 | Discharge: 2017-08-14 | Disposition: A | Discharge status: Home / Self Care | Payer: Managed Care, Other (non HMO) | Source: Ambulatory Visit | Attending: Urology | Admitting: Urology

## 2017-08-14 VITALS — BP 108/70 | HR 76 | Ht 64.0 in | Wt 182.0 lb

## 2017-08-14 DIAGNOSIS — N3946 Mixed incontinence: Secondary | ICD-10-CM | POA: Diagnosis not present

## 2017-08-14 HISTORY — DX: Hypothyroidism, unspecified: E03.9

## 2017-08-14 HISTORY — DX: Depression, unspecified: F32.A

## 2017-08-14 HISTORY — DX: Major depressive disorder, single episode, unspecified: F32.9

## 2017-08-14 HISTORY — DX: Stress incontinence (female) (male): N39.3

## 2017-08-14 NOTE — Patient Instructions (Signed)
Your procedure is scheduled on: Monday, August 21, 2017 Report to Same Day Surgery on the 2nd floor in the [facility]. To find out your arrival time, please call [phone] between 1PM - 3PM on: Friday, August 18, 2017  REMEMBER: Instructions that are not followed completely may result in serious medical risk, up to and including death; or upon the discretion of your surgeon and anesthesiologist your surgery may need to be rescheduled.  Do not eat food after midnight the night before your procedure.  No gum chewing or hard candies.  You may however, drink CLEAR liquids up to 2 hours before you are scheduled to arrive at the hospital for your procedure.  Do not drink clear liquids within 2 hours of the start of your surgery.  Clear liquids include: - water  - apple juice without pulp - clear gatorade - black coffee or tea (Do NOT add anything to the coffee or tea) Do NOT drink anything that is not on this list.  No Alcohol for 24 hours before or after surgery.  No Smoking including e-cigarettes for 24 hours prior to surgery. No chewable tobacco products for at least 6 hours prior to surgery. No nicotine patches on the day of surgery.  Notify your doctor if there is any change in your medical condition (cold, fever, infection).  Do not wear jewelry, make-up, hairpins, clips or nail polish.  Do not wear lotions, powders, or perfumes. You may wear deodorant.  Do not shave 48 hours prior to surgery.   Contacts and dentures may not be worn into surgery.  Do not bring valuables to the hospital. [facility] for any belongings or valuables.   TAKE THESE MEDICATIONS THE MORNING OF SURGERY WITH A SIP OF WATER:  1.  PROZAC 2.  SYNTHROID  NOW!  Stop Anti-inflammatories such as Advil, Aleve, Ibuprofen, Motrin, Naproxen, Naprosyn, Goodie powder, or aspirin products. (May take Tylenol or Acetaminophen if needed.)  NOW!  Stop ANY OVER THE COUNTER supplements until  after surgery.   If you are being discharged the day of surgery, you will not be allowed to drive home. You will need someone to drive you home and stay with you that night.   If you are taking public transportation, you will need to have a responsible adult to with you.  Please call the number above if you have any questions about these instructions.

## 2017-08-14 NOTE — Progress Notes (Signed)
Continuous Intermittent Catheterization  Due to upcoming bladder surgery patient is present today for a teaching of self I & O Catheterization. Patient was given detailed verbal and printed instructions of self catheterization. Patient was cleaned and prepped in a sterile fashion.  With instruction and assistance patient inserted a 14FR and urine return was noted 100 ml, urine was yellow in color. Patient tolerated well, no complications were noted Patient was given a sample bag with supplies to take home.  Instructions were given per Dr. Sherron MondayMacDiarmid for patient to cath PRN post surgery.  Preformed by: Rupert Stackshelsea Saia Derossett, LPN   Blood pressure 108/70, pulse 76, height 5\' 4"  (1.626 m), weight 182 lb (82.6 kg).

## 2017-08-20 MED ORDER — CEFAZOLIN SODIUM-DEXTROSE 2-4 GM/100ML-% IV SOLN
2.0000 g | INTRAVENOUS | Status: AC
  Administered 2017-08-21: 2 g via INTRAVENOUS

## 2017-08-20 MED ORDER — CLINDAMYCIN PHOSPHATE 600 MG/50ML IV SOLN
600.0000 mg | INTRAVENOUS | Status: AC
  Administered 2017-08-21: 600 mg via INTRAVENOUS

## 2017-08-21 ENCOUNTER — Encounter
Admission: RE | Disposition: A | Discharge status: Home / Self Care | Payer: Self-pay | Source: Ambulatory Visit | Attending: Urology

## 2017-08-21 ENCOUNTER — Encounter: Payer: Self-pay | Admitting: *Deleted

## 2017-08-21 ENCOUNTER — Ambulatory Visit
Admission: RE | Admit: 2017-08-21 | Discharge: 2017-08-21 | Disposition: A | Discharge status: Home / Self Care | Payer: Managed Care, Other (non HMO) | Source: Ambulatory Visit | Attending: Urology | Admitting: Urology

## 2017-08-21 ENCOUNTER — Ambulatory Visit: Payer: Managed Care, Other (non HMO) | Admitting: Certified Registered Nurse Anesthetist

## 2017-08-21 DIAGNOSIS — N8110 Cystocele, unspecified: Secondary | ICD-10-CM | POA: Insufficient documentation

## 2017-08-21 DIAGNOSIS — N393 Stress incontinence (female) (male): Secondary | ICD-10-CM

## 2017-08-21 DIAGNOSIS — E079 Disorder of thyroid, unspecified: Secondary | ICD-10-CM | POA: Diagnosis not present

## 2017-08-21 DIAGNOSIS — Z79899 Other long term (current) drug therapy: Secondary | ICD-10-CM | POA: Insufficient documentation

## 2017-08-21 DIAGNOSIS — Z87891 Personal history of nicotine dependence: Secondary | ICD-10-CM | POA: Diagnosis not present

## 2017-08-21 HISTORY — PX: PUBOVAGINAL SLING: SHX1035

## 2017-08-21 HISTORY — PX: CYSTOSCOPY: SHX5120

## 2017-08-21 SURGERY — CREATION, PUBOVAGINAL SLING
Anesthesia: General | Site: Bladder | Wound class: Clean Contaminated

## 2017-08-21 MED ORDER — DEXAMETHASONE SODIUM PHOSPHATE 10 MG/ML IJ SOLN
INTRAMUSCULAR | Status: AC
  Filled 2017-08-21: qty 1

## 2017-08-21 MED ORDER — FENTANYL CITRATE (PF) 100 MCG/2ML IJ SOLN
INTRAMUSCULAR | Status: DC | PRN
  Administered 2017-08-21: 50 ug via INTRAVENOUS
  Administered 2017-08-21: 25 ug via INTRAVENOUS
  Administered 2017-08-21 (×2): 50 ug via INTRAVENOUS
  Administered 2017-08-21: 25 ug via INTRAVENOUS

## 2017-08-21 MED ORDER — EPHEDRINE SULFATE 50 MG/ML IJ SOLN
INTRAMUSCULAR | Status: AC
  Filled 2017-08-21: qty 1

## 2017-08-21 MED ORDER — FLUORESCEIN SODIUM 10 % IV SOLN
INTRAVENOUS | Status: AC
  Filled 2017-08-21: qty 5

## 2017-08-21 MED ORDER — CLINDAMYCIN PHOSPHATE 600 MG/50ML IV SOLN
INTRAVENOUS | Status: AC
  Filled 2017-08-21: qty 50

## 2017-08-21 MED ORDER — DEXAMETHASONE SODIUM PHOSPHATE 10 MG/ML IJ SOLN
INTRAMUSCULAR | Status: DC | PRN
  Administered 2017-08-21: 5 mg via INTRAVENOUS

## 2017-08-21 MED ORDER — FENTANYL CITRATE (PF) 100 MCG/2ML IJ SOLN
INTRAMUSCULAR | Status: AC
  Filled 2017-08-21: qty 2

## 2017-08-21 MED ORDER — FAMOTIDINE 20 MG PO TABS
20.0000 mg | ORAL_TABLET | Freq: Once | ORAL | Status: AC
  Administered 2017-08-21: 20 mg via ORAL

## 2017-08-21 MED ORDER — LIDOCAINE HCL (CARDIAC) 20 MG/ML IV SOLN
INTRAVENOUS | Status: DC | PRN
  Administered 2017-08-21: 80 mg via INTRAVENOUS

## 2017-08-21 MED ORDER — CEFAZOLIN SODIUM-DEXTROSE 2-4 GM/100ML-% IV SOLN
INTRAVENOUS | Status: AC
  Filled 2017-08-21: qty 100

## 2017-08-21 MED ORDER — LACTATED RINGERS IV SOLN
INTRAVENOUS | Status: DC
  Administered 2017-08-21: 11:00:00 via INTRAVENOUS

## 2017-08-21 MED ORDER — LIDOCAINE-EPINEPHRINE (PF) 1 %-1:200000 IJ SOLN
INTRAMUSCULAR | Status: AC
  Filled 2017-08-21: qty 30

## 2017-08-21 MED ORDER — LIDOCAINE-EPINEPHRINE (PF) 1 %-1:200000 IJ SOLN
INTRAMUSCULAR | Status: DC | PRN
  Administered 2017-08-21: 4 mL

## 2017-08-21 MED ORDER — SUCCINYLCHOLINE CHLORIDE 20 MG/ML IJ SOLN
INTRAMUSCULAR | Status: AC
  Filled 2017-08-21: qty 1

## 2017-08-21 MED ORDER — MIDAZOLAM HCL 2 MG/2ML IJ SOLN
INTRAMUSCULAR | Status: DC | PRN
  Administered 2017-08-21: 2 mg via INTRAVENOUS

## 2017-08-21 MED ORDER — ESTROGENS, CONJUGATED 0.625 MG/GM VA CREA
TOPICAL_CREAM | VAGINAL | Status: DC | PRN
  Administered 2017-08-21: 1 via VAGINAL

## 2017-08-21 MED ORDER — MIDAZOLAM HCL 2 MG/2ML IJ SOLN
INTRAMUSCULAR | Status: AC
  Filled 2017-08-21: qty 2

## 2017-08-21 MED ORDER — ESTRADIOL 0.1 MG/GM VA CREA: TOPICAL_CREAM | VAGINAL | Status: DC | PRN

## 2017-08-21 MED ORDER — PROPOFOL 10 MG/ML IV BOLUS
INTRAVENOUS | Status: AC
  Filled 2017-08-21: qty 20

## 2017-08-21 MED ORDER — FENTANYL CITRATE (PF) 100 MCG/2ML IJ SOLN: 25.0000 ug | INTRAMUSCULAR | Status: DC | PRN

## 2017-08-21 MED ORDER — LIDOCAINE HCL (PF) 2 % IJ SOLN
INTRAMUSCULAR | Status: AC
  Filled 2017-08-21: qty 10

## 2017-08-21 MED ORDER — ESTROGENS, CONJUGATED 0.625 MG/GM VA CREA
TOPICAL_CREAM | VAGINAL | Status: AC
  Filled 2017-08-21: qty 30

## 2017-08-21 MED ORDER — PROPOFOL 10 MG/ML IV BOLUS
INTRAVENOUS | Status: DC | PRN
  Administered 2017-08-21: 50 mg via INTRAVENOUS
  Administered 2017-08-21: 150 mg via INTRAVENOUS

## 2017-08-21 MED ORDER — EPHEDRINE SULFATE 50 MG/ML IJ SOLN
INTRAMUSCULAR | Status: DC | PRN
  Administered 2017-08-21 (×3): 10 mg via INTRAVENOUS

## 2017-08-21 MED ORDER — FLUORESCEIN SODIUM 10 % IV SOLN
INTRAVENOUS | Status: DC | PRN
  Administered 2017-08-21: .5 mL via INTRAVENOUS

## 2017-08-21 MED ORDER — PROMETHAZINE HCL 25 MG/ML IJ SOLN: 6.2500 mg | INTRAMUSCULAR | Status: DC | PRN

## 2017-08-21 MED ORDER — ONDANSETRON HCL 4 MG/2ML IJ SOLN
INTRAMUSCULAR | Status: AC
  Filled 2017-08-21: qty 2

## 2017-08-21 MED ORDER — FAMOTIDINE 20 MG PO TABS
ORAL_TABLET | ORAL | Status: AC
  Administered 2017-08-21: 20 mg via ORAL
  Filled 2017-08-21: qty 1

## 2017-08-21 MED ORDER — ONDANSETRON HCL 4 MG/2ML IJ SOLN
INTRAMUSCULAR | Status: DC | PRN
  Administered 2017-08-21: 4 mg via INTRAVENOUS

## 2017-08-21 SURGICAL SUPPLY — 46 items
BAG URINE DRAINAGE (UROLOGICAL SUPPLIES) ×3 IMPLANT
BLADE CLIPPER SURG (BLADE) ×3 IMPLANT
BLADE SURG 15 STRL LF DISP TIS (BLADE) ×1 IMPLANT
BLADE SURG 15 STRL SS (BLADE) ×2
BLADE SURG SZ10 CARB STEEL (BLADE) ×3 IMPLANT
CANISTER SUCT 1200ML W/VALVE (MISCELLANEOUS) ×3 IMPLANT
CATH FOLEY SIL 2WAY 14FR5CC (CATHETERS) ×3 IMPLANT
COVER MAYO STAND STRL (DRAPES) ×6 IMPLANT
DERMABOND ADVANCED (GAUZE/BANDAGES/DRESSINGS) ×2
DERMABOND ADVANCED .7 DNX12 (GAUZE/BANDAGES/DRESSINGS) ×1 IMPLANT
DRAPE LAP W/FLUID (DRAPES) ×3 IMPLANT
DRAPE UNDER BUTTOCK W/FLU (DRAPES) ×3 IMPLANT
ELECT REM PT RETURN 9FT ADLT (ELECTROSURGICAL) ×3
ELECTRODE REM PT RTRN 9FT ADLT (ELECTROSURGICAL) ×1 IMPLANT
GLOVE BIO SURGEON STRL SZ7.5 (GLOVE) ×6 IMPLANT
GOWN STRL REUS W/ TWL LRG LVL3 (GOWN DISPOSABLE) ×2 IMPLANT
GOWN STRL REUS W/ TWL XL LVL3 (GOWN DISPOSABLE) ×1 IMPLANT
GOWN STRL REUS W/TWL LRG LVL3 (GOWN DISPOSABLE) ×4
GOWN STRL REUS W/TWL XL LVL3 (GOWN DISPOSABLE) ×2
HOLDER FOLEY CATH W/STRAP (MISCELLANEOUS) IMPLANT
KIT RM TURNOVER STRD PROC AR (KITS) ×3 IMPLANT
NEEDLE HYPO 22GX1.5 SAFETY (NEEDLE) ×3 IMPLANT
PACK BASIN MINOR ARMC (MISCELLANEOUS) ×3 IMPLANT
PENCIL ELECTRO HAND CTR (MISCELLANEOUS) ×3 IMPLANT
PLUG CATH AND CAP STER (CATHETERS) ×3 IMPLANT
RETRACTOR STERILE 25.8CMX11.3 (INSTRUMENTS) IMPLANT
RING RETRACTOR 18.6X8.9 3309G (MISCELLANEOUS) IMPLANT
RING RETRACTOR 28.3X18.3 3308G (MISCELLANEOUS) IMPLANT
SET CYSTO W/LG BORE CLAMP LF (SET/KITS/TRAYS/PACK) ×3 IMPLANT
SET YANKAUER POOLE SUCT (MISCELLANEOUS) ×3 IMPLANT
SLING SUPRIS RETROPUBIC KIT (Miscellaneous) ×3 IMPLANT
SPONGE XRAY 4X4 16PLY STRL (MISCELLANEOUS) ×6 IMPLANT
SURGILUBE 2OZ TUBE FLIPTOP (MISCELLANEOUS) ×3 IMPLANT
SUT VIC AB 2-0 CT1 27 (SUTURE) ×4
SUT VIC AB 2-0 CT1 TAPERPNT 27 (SUTURE) ×2 IMPLANT
SUT VIC AB 2-0 SH 27 (SUTURE)
SUT VIC AB 2-0 SH 27XBRD (SUTURE) IMPLANT
SUT VIC AB 4-0 PS2 18 (SUTURE) ×3 IMPLANT
SYR 10ML LL (SYRINGE) ×3 IMPLANT
SYR BULB IRRIG 60ML STRL (SYRINGE) ×3 IMPLANT
SYR CONTROL 10ML (SYRINGE) ×3 IMPLANT
TRAY PREP VAG/GEN (MISCELLANEOUS) ×3 IMPLANT
TUBING CONNECTING 10 (TUBING) ×4 IMPLANT
TUBING CONNECTING 10' (TUBING) ×2
WATER STERILE IRR 1000ML POUR (IV SOLUTION) ×3 IMPLANT
WATER STERILE IRR 3000ML UROMA (IV SOLUTION) ×3 IMPLANT

## 2017-08-21 NOTE — Interval H&P Note (Signed)
History and Physical Interval Note:  08/21/2017 11:16 AM  Abigail West  has presented today for surgery, with the diagnosis of stress incontinence  The various methods of treatment have been discussed with the patient and family. After consideration of risks, benefits and other options for treatment, the patient has consented to  Procedure(s): PUBO-VAGINAL SLING (N/A) as a surgical intervention .  The patient's history has been reviewed, patient examined, no change in status, stable for surgery.  I have reviewed the patient's chart and labs.  Questions were answered to the patient's satisfaction.     Jelitza Manninen A

## 2017-08-21 NOTE — Anesthesia Post-op Follow-up Note (Signed)
Anesthesia QCDR form completed.        

## 2017-08-21 NOTE — Anesthesia Preprocedure Evaluation (Signed)
Anesthesia Evaluation  Patient identified by MRN, date of birth, ID band Patient awake    Reviewed: Allergy & Precautions, H&P , NPO status , Patient's Chart, lab work & pertinent test results, reviewed documented beta blocker date and time   History of Anesthesia Complications Negative for: history of anesthetic complications  Airway Mallampati: I  TM Distance: >3 FB Neck ROM: full    Dental  (+) Missing, Dental Advidsory Given, Teeth Intact   Pulmonary neg pulmonary ROS, former smoker,           Cardiovascular Exercise Tolerance: Good negative cardio ROS       Neuro/Psych PSYCHIATRIC DISORDERS negative neurological ROS     GI/Hepatic negative GI ROS, Neg liver ROS,   Endo/Other  neg diabetesHypothyroidism   Renal/GU negative Renal ROS  negative genitourinary   Musculoskeletal   Abdominal   Peds  Hematology negative hematology ROS (+)   Anesthesia Other Findings Past Medical History: No date: Depression No date: Hypothyroidism No date: Stress incontinence No date: Thyroid disease   Reproductive/Obstetrics negative OB ROS                             Anesthesia Physical Anesthesia Plan  ASA: II  Anesthesia Plan: General   Post-op Pain Management:    Induction: Intravenous  PONV Risk Score and Plan: 3 and Ondansetron and Dexamethasone  Airway Management Planned: Oral ETT  Additional Equipment:   Intra-op Plan:   Post-operative Plan: Extubation in OR  Informed Consent: I have reviewed the patients History and Physical, chart, labs and discussed the procedure including the risks, benefits and alternatives for the proposed anesthesia with the patient or authorized representative who has indicated his/her understanding and acceptance.   Dental Advisory Given  Plan Discussed with: Anesthesiologist, CRNA and Surgeon  Anesthesia Plan Comments:         Anesthesia  Quick Evaluation

## 2017-08-21 NOTE — Op Note (Signed)
Preoperative diagnosis: Stress urinary incontinence Postoperative diagnosis: Stress urinary incontinence Surgery: Sling cystourethropexy and cystoscopy Surgeon: Dr. Lorin PicketScott Tykiera Raven  The patient has the above diagnosis and consented to the above procedure.  Preoperative antibiotics were given.  Extra care was taken with leg positioning to minimize the risk of compartment syndrome and neuropathy and deep vein thrombosis.  The patient had moderate descensus of the bladder neck and urethra at rest and a modest cystocele but such that she would not be at increased risk of a hinge effect.  The urethra was already looking upward somewhat  I made 2 1 cm incisions 1.5 cm lateral to the midline 1 fingerbreadth above the symphysis pubis.    She had a short urethra.  I was very diligent in marking the suburethral incision with a marking pen and after instilling approximately 2 cc of lidocaine epinephrine mixture I made an appropriate depth incision.  I mobilized to the urethrovesical angle bilaterally.  She was quite oozy at that stage.  With the bladder emptied I passed the trocar on top of and along the back of the symphysis pubis under the pulp of my index finger bilaterally.  I used my box technique.  I cystoscoped the patient.  I double checked the dome and there was no bladder injury.  There is no trocar in the bladder.  There is no movement of the bladder with movement of the trocar.  There was excellent efflux bilaterally.  The urethra was normal.  She had a cystocele  With the bladder emptied I attached the mesh with the described technique and brought it up through the retropubic space.  I tensioned the sling over the fat part of a moderate size Kelly clamp.  The mesh laid nice and flat.  It almost laid across the majority of her urethral length but was positioned in the middle.  I was very diligent in adjusting the tension.  My attention demonstrated good hypermobility and no spring back effect.  I  let the mesh just barely touch the urethra a few millimeters tighter than my usual but within my normal window.  I did this because she had descensus at rest.I did not want to tie the mesh tighter than I did.  Irrigation was utilized.  I closed the anterior vaginal wall with running 2-0 Vicryl on a CT1 needle followed by 2 interrupted sutures.  I cut the mesh below the skin.  I closed the 2 abdominal incisions with interrupted 4-0 Vicryl and Dermabond.  Vaginal pack was applied  Blood loss was less than 200 mL.  Overall the case went very well.  Hopefully the patient will reach her treatment goal.

## 2017-08-21 NOTE — OR Nursing (Signed)
Pt to bathroom voided approx 50 ml urine. Bladder scan done 49 ml. Dr Sherron MondayMacdiarmid notified.

## 2017-08-21 NOTE — Progress Notes (Signed)
All questions answered No changes in bladder Chest clear and HS normal

## 2017-08-21 NOTE — H&P (Signed)
Chief Complaint  Patient presents with  . Urinary Incontinence    HPI: Carollee HerterShannon: The patient was having urgency but no urge incontinence but also stress incontinence she was leaking day and night and wearing one to 2 pads a day. It was noted she was voiding every 30 minutes and getting up 2-3 times at night. Her residual was 0 mL. She was noted to have a grade 2 cystocele. She was started on estrogen cream and her compliance has been less than ideal. She was given the beta 3 agonists and 25 mg and behavioral therapy  Today Currently the patient's primary problem is leaking with coughing sneezing bending and lifting and with exercise. She reports perhaps rare urge incontinence. She does not have bedwetting. She gets up once at night and voids every 1 hour due to some suprapubic pressure. It is relieved by voiding. She cannot hold urination for 2 hours.  Her flow was good and she does feel empty.  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 Grade 1 cystocele with a little bit of rotational descent. She is not at risk of a hinge effect. She did not leak  The patient has mixed incontinence but primarily stress incontinence. She does have significant frequency. She does have some suprapubic pressure. My index of suspicion is low that she is interstitial cystitis but evidence of such would be looledat the time of urodynamics.  Bladder capacity was 637 mL. Bladder was stable. She felt some pressure but no bladder pain. At 200 mL she leaked a mild amount at 85 cm of water. At 400 mL she leaked a mild amount of 52 cm water. She was tensing her muscles try not to leak and was coached. Her cough leak point pressure 500 mL was 56 cm water there was mild to moderate. During voiding she voided 420 mL. Max flow was 24 mils per second. Voiding pressure was 19 cm water. She did not empty efficiently.  EMG activity increased some during the voiding phase. She did have trouble voiding in the lab setting. Bladder neck descended about 2 cm.  I repeated the patient's history. She can deal with the incontinence with coughing sneezing but she does leak a lot limiting exercising. I don't think she's tried any other medication reviewed her medical record and she was not sure if she ever tried Information systems managerVesicare. Again she didn't know how long she stayed on theB325 mg. She was helping her frequency.  She does have significant dyspareunia. She does admit an uncomfortable feeling in her pressure in the suprapubic area but it is somewhat nonspecific and milder  The patient does have stress incontinence affecting her quality of life. She is bothered her a lot by her hourly frequency affecting her quality life and has vague pelvic symptoms. The role of a hydrodistention with my usual template was discussed. My index of suspicion is lower but she could have interstitial cystitis and she is aware that surgery could unmask or make her symptoms worse. She would also need to be warned of more issues with potentially retention post procedure. I encouraged behavioral and physical therapy and a trial of beta 3 agonist 50 mg and Vesicare 5 mg. I decided to treat her with double therapy with free samples. If she does very well I will stop one of them.  A hydrodistention may or may not be needed in the  future. I usually like to do this prior to slings. The potentialinconvenience of this was discussed today.  Today The patient still has incontinence.  She took the medications independent of one another and they both helped some.  She still has some pressure and dyspareunia.  I drew her a picture.  We talked about a sling versus medication and she does not want to see the physical therapist.  She can live with the frequency and nocturia and the coughing and sneezing leaking but really wants to exercise and describes double  padding  She understands that she could have interstitial cystitis but my index of suspicion is on the lower side and she understands I do not like hydrodistended at the time of the sling recognizing the imperfections of this test and others and firming up the diagnosis.  This was discussed  My usual template was discussed.  Mesh issues discussed.  Upper regulation of pain syndrome and higher retention rates from pelvic floor dyssynergia discussed.   PMH:     Past Medical History:  Diagnosis Date  . Thyroid disease     Surgical History:      Past Surgical History:  Procedure Laterality Date  . THYROIDECTOMY  1989    Home Medications:  Allergies as of 07/03/2017   No Known Allergies                 Medication List             Accurate as of 07/03/17  3:56 PM. Always use your most recent med list.           FLUoxetine 20 MG capsule Commonly known as:  PROZAC Take 20 mg by mouth daily.   levothyroxine 88 MCG tablet Commonly known as:  SYNTHROID, LEVOTHROID Take 88 mcg by mouth daily.   VESICARE 10 MG tablet Generic drug:  solifenacin Take by mouth.       Allergies: No Known Allergies  Family History:      Family History  Problem Relation Age of Onset  . Breast cancer Neg Hx     Social History:  reports that she quit smoking about 4 years ago. Her smoking use included cigarettes. she has never used smokeless tobacco. She reports that she drinks about 1.8 oz of alcohol per week. She reports that she does not use drugs.  ROS: UROLOGY Frequent Urination?: Yes Hard to postpone urination?: No Burning/pain with urination?: No Get up at night to urinate?: Yes Leakage of urine?: No Urine stream starts and stops?: Yes Trouble starting stream?: No Do you have to strain to urinate?: No Blood in urine?: No Urinary tract infection?: No Sexually transmitted disease?: No Injury to kidneys or bladder?: No Painful intercourse?:  Yes Weak stream?: No Currently pregnant?: No Vaginal bleeding?: No Last menstrual period?: n  Gastrointestinal Nausea?: No Vomiting?: No Indigestion/heartburn?: No Diarrhea?: No Constipation?: No  Constitutional Fever: No Night sweats?: No Weight loss?: No Fatigue?: No  Skin Skin rash/lesions?: No Itching?: No  Eyes Blurred vision?: No Double vision?: No  Ears/Nose/Throat Sore throat?: No Sinus problems?: No  Hematologic/Lymphatic Swollen glands?: No Easy bruising?: No  Cardiovascular Leg swelling?: No Chest pain?: No  Respiratory Cough?: No Shortness of breath?: No   Musculoskeletal Back pain?: No Joint pain?: No  Neurological Headaches?: No Dizziness?: No  Psychologic Depression?: No Anxiety?: No  Physical Exam: BP 109/75 (BP Location: Right Arm, Patient Position: Sitting, Cuff Size: Large)   Pulse 72   Ht 5\' 4"  (1.626  m)   Wt 182 lb 14.4 oz (83 kg)   BMI 31.39 kg/m   Constitutional:  Alert and oriented, No acute distress.   Laboratory Data: RecentLabs  No results found for: WBC, HGB, HCT, MCV, PLT    RecentLabs  No results found for: CREATININE    RecentLabs  No results found for: PSA    RecentLabs  No results found for: TESTOSTERONE    RecentLabs  No results found for: HGBA1C    Urinalysis Labs(Brief)          Component Value Date/Time   APPEARANCEUR Clear 02/01/2017 1529   GLUCOSEU Negative 02/01/2017 1529   BILIRUBINUR Negative 02/01/2017 1529   PROTEINUR Negative 02/01/2017 1529   NITRITE Negative 02/01/2017 1529   LEUKOCYTESUR Negative 02/01/2017 1529      Pertinent Imaging: None   Assessment & Plan: I think the patient is making a good decision.  It is a difficult one for her.  I certainly can empathize with her and hopefully she will reach her treatment goal with a sling.  We will arrange this  There are no diagnoses linked to this encounter.  No Follow-up on  file.  After a thorough review of the management options for the patient's condition the patient  elected to proceed with surgical therapy as noted above. We have discussed the potential benefits and risks of the procedure, side effects of the proposed treatment, the likelihood of the patient achieving the goals of the procedure, and any potential problems that might occur during the procedure or recuperation. Informed consent has been obtained.    Ayomide Purdy A, MD

## 2017-08-21 NOTE — Anesthesia Procedure Notes (Signed)
Procedure Name: LMA Insertion Date/Time: 08/21/2017 11:33 AM Performed by: Stormy Fabianurtis, Tonda Wiederhold, CRNA Pre-anesthesia Checklist: Patient identified, Patient being monitored, Timeout performed, Emergency Drugs available and Suction available Patient Re-evaluated:Patient Re-evaluated prior to induction Oxygen Delivery Method: Circle system utilized Preoxygenation: Pre-oxygenation with 100% oxygen Induction Type: IV induction Ventilation: Mask ventilation without difficulty LMA: LMA inserted LMA Size: 3.5 Tube type: Oral Number of attempts: 1 Placement Confirmation: positive ETCO2 and breath sounds checked- equal and bilateral Tube secured with: Tape Dental Injury: Teeth and Oropharynx as per pre-operative assessment

## 2017-08-21 NOTE — Discharge Instructions (Signed)
AMBULATORY SURGERY  °DISCHARGE INSTRUCTIONS ° ° °1) The drugs that you were given will stay in your system until tomorrow so for the next 24 hours you should not: ° °A) Drive an automobile °B) Make any legal decisions °C) Drink any alcoholic beverage ° ° °2) You may resume regular meals tomorrow.  Today it is better to start with liquids and gradually work up to solid foods. ° °You may eat anything you prefer, but it is better to start with liquids, then soup and crackers, and gradually work up to solid foods. ° ° °3) Please notify your doctor immediately if you have any unusual bleeding, trouble breathing, redness and pain at the surgery site, drainage, fever, or pain not relieved by medication. ° ° ° °4) Additional Instructions: ° ° ° ° ° ° ° °Please contact your physician with any problems or Same Day Surgery at [phone], Monday through Friday 6 am to 4 pm, or [facility] number at [phone].I have reviewed discharge instructions in detail with the patient. They will follow-up with me or their physician as scheduled. My nurse will also be calling the patients as per protocol.  °

## 2017-08-21 NOTE — Interval H&P Note (Signed)
History and Physical Interval Note:  08/21/2017 11:16 AM  Abigail West  has presented today for surgery, with the diagnosis of stress incontinence  The various methods of treatment have been discussed with the patient and family. After consideration of risks, benefits and other options for treatment, the patient has consented to  Procedure(s): PUBO-VAGINAL SLING (N/A) as a surgical intervention .  The patient's history has been reviewed, patient examined, no change in status, stable for surgery.  I have reviewed the patient's chart and labs.  Questions were answered to the patient's satisfaction.     Sloka Volante A   

## 2017-08-21 NOTE — Transfer of Care (Signed)
Immediate Anesthesia Transfer of Care Note  Patient: Abigail West  Procedure(s) Performed: Procedure(s): PUBO-VAGINAL SLING (N/A) CYSTOSCOPY (N/A)  Patient Location: PACU  Anesthesia Type:General  Level of Consciousness: sedated  Airway & Oxygen Therapy: Patient Spontanous Breathing and Patient connected to face mask oxygen  Post-op Assessment: Report given to RN and Post -op Vital signs reviewed and stable  Post vital signs: Reviewed and stable  Last Vitals:  Vitals:   08/21/17 1035 08/21/17 1245  BP: 121/72 140/73  Pulse: 72 94  Resp: 15 15  Temp: (!) 36.1 C (!) 36.2 C  SpO2: 97% 100%    Complications: No apparent anesthesia complications

## 2017-08-22 ENCOUNTER — Encounter: Payer: Self-pay | Admitting: Urology

## 2017-08-22 NOTE — Anesthesia Postprocedure Evaluation (Signed)
Anesthesia Post Note  Patient: [name]  Procedure(s) Performed: Leonides GrillsPUBO-VAGINAL SLING (N/A Bladder) CYSTOSCOPY (N/A Bladder)  Patient location during evaluation: PACU Anesthesia Type: General Level of consciousness: awake and alert Pain management: pain level controlled Vital Signs Assessment: post-procedure vital signs reviewed and stable Respiratory status: spontaneous breathing, nonlabored ventilation, respiratory function stable and patient connected to nasal cannula oxygen Cardiovascular status: blood pressure returned to baseline and stable Postop Assessment: no apparent nausea or vomiting Anesthetic complications: no     Last Vitals:  Vitals:   08/21/17 1335 08/21/17 1401  BP: (!) 129/59 (!) 128/57  Pulse: 79 69  Resp: 16 16  Temp: 36.6 C   SpO2: 99% 100%    Last Pain:  Vitals:   08/21/17 1245  TempSrc: Temporal                 Lenard SimmerAndrew Amisha Pospisil

## 2017-09-04 ENCOUNTER — Ambulatory Visit (INDEPENDENT_AMBULATORY_CARE_PROVIDER_SITE_OTHER): Payer: Managed Care, Other (non HMO) | Admitting: Urology

## 2017-09-04 ENCOUNTER — Encounter: Payer: Self-pay | Admitting: Urology

## 2017-09-04 VITALS — BP 118/77 | HR 69 | Ht 64.0 in | Wt 187.9 lb

## 2017-09-04 DIAGNOSIS — N3946 Mixed incontinence: Secondary | ICD-10-CM

## 2017-09-04 NOTE — Progress Notes (Signed)
09/04/2017 11:27 AM   Abigail West 03/01/1961 [id number]  Referring provider: Schermerhorn, Ihor Austinhomas J, MD [address] [facility] [place], [place] [id number]  Chief Complaint  Patient presents with  . Urinary Incontinence    HPI: The patient had a sling approximately 2 weeks ago.  She had mixed incontinence and frequency affecting her quality of life.  Her stress incontinence especially at the gym was affecting her quality of life.  She failed medication as noted.  She had vague pelvic discomfort.  My index of suspicion was low that she could have interstitial cystitis but this was discussed.  Preoperatively she had some dyspareunia as well.  Initially she voided very well but then through [place] office start to self catheterize to check some residuals.  She was followed by telephone  She had a short urethra noted in the sling laid across the majority of the length of the urethra.  I was diligent in adjusting the tension.  I tensioned it relative to her urethral descensus at rest  Today The patient is voiding well and sometimes straightens her lower torso in an attempt to void more freely.  She had one residual of 400 mL but stopped catheterizing for low residuals many days ago.  She is not having vaginal pain.  She still goes frequently and possibly a little bit less so.  She is not having upper regulation of pressure or pain.  She is not having any vaginal bleeding.  She is wearing a pad for confidence and she thinks she may have a little bit of discharge but she thinks she is continent.  She did not leak when she has coughed but I do not think she has been coughing a lot  On pelvic examination she had a well-healed incision line.  She had appropriate hypermobility of the bladder neck and urethra with no stress incontinence.  She had less descensus at rest versus under anesthesia     PMH: Past Medical History:  Diagnosis Date  . Depression   .  Hypothyroidism   . Stress incontinence   . Thyroid disease     Surgical History: Past Surgical History:  Procedure Laterality Date  . COLONOSCOPY    . CYSTOSCOPY N/A 08/21/2017   Procedure: CYSTOSCOPY;  Surgeon: Alfredo MartinezMacDiarmid, Breeana Sawtelle, MD;  Location: [facility] ORS;  Service: Urology;  Laterality: N/A;  . PUBOVAGINAL SLING N/A 08/21/2017   Procedure: Leonides GrillsPUBO-VAGINAL SLING;  Surgeon: Alfredo MartinezMacDiarmid, Dusti Tetro, MD;  Location: [facility] ORS;  Service: Urology;  Laterality: N/A;  . THYROIDECTOMY  1989    Home Medications:  Allergies as of 09/04/2017   No Known Allergies     Medication List        Accurate as of 09/04/17 11:27 AM. Always use your most recent med list.          cetirizine 10 MG tablet Commonly known as:  ZYRTEC Take 10 mg by mouth daily as needed for allergies.   FLUoxetine 20 MG capsule Commonly known as:  PROZAC Take 20 mg by mouth daily.   ibuprofen 200 MG tablet Commonly known as:  ADVIL,MOTRIN Take 400 mg by mouth daily as needed for headache or moderate pain.   levothyroxine 88 MCG tablet Commonly known as:  SYNTHROID, LEVOTHROID Take 88 mcg by mouth daily.       Allergies: No Known Allergies  Family History: Family History  Problem Relation Age of Onset  . Parkinson's disease Mother   . Dementia Mother   . Heart  attack Father   . Breast cancer Neg Hx     Social History:  reports that she quit smoking about 5 years ago. Her smoking use included cigarettes. she has never used smokeless tobacco. She reports that she drinks about 1.8 oz of alcohol per week. She reports that she does not use drugs.  ROS: UROLOGY Frequent Urination?: Yes Hard to postpone urination?: No Burning/pain with urination?: No Get up at night to urinate?: Yes Leakage of urine?: Yes Urine stream starts and stops?: No Trouble starting stream?: No Do you have to strain to urinate?: No Blood in urine?: No Urinary tract infection?: No Sexually transmitted disease?: No Injury to kidneys or  bladder?: No Painful intercourse?: No Weak stream?: No Currently pregnant?: No Vaginal bleeding?: No Last menstrual period?: n  Gastrointestinal Nausea?: No Vomiting?: No Indigestion/heartburn?: No Diarrhea?: No Constipation?: No  Constitutional Fever: No Night sweats?: No Weight loss?: No Fatigue?: No  Skin Skin rash/lesions?: No Itching?: No  Eyes Blurred vision?: No Double vision?: No  Ears/Nose/Throat Sore throat?: No Sinus problems?: No  Hematologic/Lymphatic Swollen glands?: No Easy bruising?: No  Cardiovascular Leg swelling?: No Chest pain?: No  Respiratory Cough?: No Shortness of breath?: No  Endocrine Excessive thirst?: No  Musculoskeletal Back pain?: No Joint pain?: No  Neurological Headaches?: No Dizziness?: No  Psychologic Depression?: No Anxiety?: No  Physical Exam: BP 118/77 (BP Location: Right Arm, Patient Position: Sitting, Cuff Size: Large)   Pulse 69   Ht 5\' 4"  (1.626 m)   Wt 187 lb 14.4 oz (85.2 kg)   BMI 32.25 kg/m   Constitutional:  Alert and oriented, No acute distress.  Laboratory Data: No results found for: WBC, HGB, HCT, MCV, PLT  No results found for: CREATININE  No results found for: PSA  No results found for: TESTOSTERONE  No results found for: HGBA1C  Urinalysis    Component Value Date/Time   APPEARANCEUR Clear 02/01/2017 1529   GLUCOSEU Negative 02/01/2017 1529   BILIRUBINUR Negative 02/01/2017 1529   PROTEINUR Negative 02/01/2017 1529   NITRITE Negative 02/01/2017 1529   LEUKOCYTESUR Negative 02/01/2017 1529    Pertinent Imaging: none  Assessment & Plan: I am pleased for the patient's postoperative course.  I will reevaluate her in 10 weeks.  Patient was encouraged to continue her course and hopefully she has an excellent long-term treatment result    There are no diagnoses linked to this encounter.  No Follow-up on file.  Martina Sinner, MD  [facility] [address] [place], [place] [address] [phone]

## 2017-11-13 ENCOUNTER — Ambulatory Visit (INDEPENDENT_AMBULATORY_CARE_PROVIDER_SITE_OTHER): Payer: Managed Care, Other (non HMO) | Admitting: Urology

## 2017-11-13 ENCOUNTER — Encounter: Payer: Self-pay | Admitting: Urology

## 2017-11-13 VITALS — BP 114/77 | HR 75 | Ht 64.0 in | Wt 189.0 lb

## 2017-11-13 DIAGNOSIS — N3946 Mixed incontinence: Secondary | ICD-10-CM

## 2017-11-13 LAB — URINALYSIS, COMPLETE
Bilirubin, UA: NEGATIVE
Glucose, UA: NEGATIVE
Ketones, UA: NEGATIVE
LEUKOCYTES UA: NEGATIVE
Nitrite, UA: NEGATIVE
PH UA: 6 (ref 5.0–7.5)
PROTEIN UA: NEGATIVE
RBC UA: NEGATIVE
Specific Gravity, UA: 1.015 (ref 1.005–1.030)
Urobilinogen, Ur: 0.2 mg/dL (ref 0.2–1.0)

## 2017-11-13 NOTE — Progress Notes (Signed)
11/13/2017 11:03 AM   Abigail West 02/16/1961 [id number]  Referring provider: Marisue IvanLinthavong, Kanhka, MD [phone] [facility] [place], [place] [id number]  Chief Complaint  Patient presents with  . Urinary Incontinence    HPI: The patient had a sling approximately 2 weeks ago.  She had mixed incontinence and frequency affecting her quality of life.  Her stress incontinence especially at the gym was affecting her quality of life.  She failed medication as noted.  She had vague pelvic discomfort.  My index of suspicion was low that she could have interstitial cystitis but this was discussed.  Preoperatively she had some dyspareunia as well.  Initially she voided very well but then through [place] office start to self catheterize to check some residuals.  She was followed by telephone  She had a short urethra noted in the sling laid across the majority of the length of the urethra.  I was diligent in adjusting the tension.  I tensioned it relative to her urethral descensus at rest  Today The patient is voiding well and sometimes straightens her lower torso in an attempt to void more freely.  She had one residual of 400 mL but stopped catheterizing for low residuals many days ago.  She is not having vaginal pain.  She still goes frequently and possibly a little bit less so.  She is not having upper regulation of pressure or pain.  She is not having any vaginal bleeding.  She is wearing a pad for confidence and she thinks she may have a little bit of discharge but she thinks she is continent.  She did not leak when she has coughed but I do not think she has been coughing a lot  On pelvic examination she had a well-healed incision line.  She had appropriate hypermobility of the bladder neck and urethra with no stress incontinence.  She had less descensus at rest versus under anesthesia  Today Patient has not leaked in 3 months but is not back to the gym.  Her  bladder is a bit less urgent and she is pleased.  Sometimes she will have stopping and starting of her flow and she will lift her torso upwards to empty.  She does feel empty.  She has not had any upper regulation of pelvic pain.  On pelvic examination the patient had appropriate hypermobility the bladder neck and a negative cough test.  I could not see or feel any mesh extrusion      PMH: Past Medical History:  Diagnosis Date  . Depression   . Hypothyroidism   . Stress incontinence   . Thyroid disease     Surgical History: Past Surgical History:  Procedure Laterality Date  . COLONOSCOPY    . CYSTOSCOPY N/A 08/21/2017   Procedure: CYSTOSCOPY;  Surgeon: Alfredo MartinezMacDiarmid, Etana Beets, MD;  Location: [facility] ORS;  Service: Urology;  Laterality: N/A;  . PUBOVAGINAL SLING N/A 08/21/2017   Procedure: Leonides GrillsPUBO-VAGINAL SLING;  Surgeon: Alfredo MartinezMacDiarmid, Azaiah Licciardi, MD;  Location: [facility] ORS;  Service: Urology;  Laterality: N/A;  . THYROIDECTOMY  1989    Home Medications:  Allergies as of 11/13/2017   No Known Allergies     Medication List        Accurate as of 11/13/17 11:03 AM. Always use your most recent med list.          cetirizine 10 MG tablet Commonly known as:  ZYRTEC Take 10 mg by mouth daily as needed for allergies.   FLUoxetine  20 MG capsule Commonly known as:  PROZAC Take 20 mg by mouth daily.   ibuprofen 200 MG tablet Commonly known as:  ADVIL,MOTRIN Take 400 mg by mouth daily as needed for headache or moderate pain.   levothyroxine 88 MCG tablet Commonly known as:  SYNTHROID, LEVOTHROID Take 88 mcg by mouth daily.       Allergies: No Known Allergies  Family History: Family History  Problem Relation Age of Onset  . Parkinson's disease Mother   . Dementia Mother   . Heart attack Father   . Breast cancer Neg Hx     Social History:  reports that she quit smoking about 5 years ago. Her smoking use included cigarettes. She has never used smokeless tobacco. She reports that she drinks  about 1.8 oz of alcohol per week. She reports that she does not use drugs.  ROS: UROLOGY Frequent Urination?: No Hard to postpone urination?: No Burning/pain with urination?: No Get up at night to urinate?: Yes Leakage of urine?: No Urine stream starts and stops?: Yes Trouble starting stream?: No Do you have to strain to urinate?: No Blood in urine?: No Urinary tract infection?: No Sexually transmitted disease?: No Injury to kidneys or bladder?: No Painful intercourse?: No Weak stream?: No Currently pregnant?: No Vaginal bleeding?: No Last menstrual period?: n  Gastrointestinal Nausea?: No Vomiting?: No Indigestion/heartburn?: No Diarrhea?: No Constipation?: No  Constitutional Fever: No Night sweats?: No Weight loss?: No Fatigue?: No  Skin Skin rash/lesions?: No Itching?: No  Eyes Blurred vision?: No Double vision?: No  Ears/Nose/Throat Sore throat?: No Sinus problems?: No  Hematologic/Lymphatic Swollen glands?: No Easy bruising?: No  Cardiovascular Leg swelling?: No Chest pain?: No  Respiratory Cough?: No Shortness of breath?: No  Endocrine Excessive thirst?: No  Musculoskeletal Back pain?: Yes Joint pain?: No  Neurological Headaches?: No Dizziness?: No  Psychologic Depression?: No Anxiety?: No  Physical Exam: BP 114/77 (BP Location: Right Arm, Patient Position: Sitting, Cuff Size: Large)   Pulse 75   Ht 5\' 4"  (1.626 m)   Wt 189 lb (85.7 kg)   BMI 32.44 kg/m   Constitutional:  Alert and oriented, No acute distress.  Laboratory Data: No results found for: WBC, HGB, HCT, MCV, PLT  No results found for: CREATININE  No results found for: PSA  No results found for: TESTOSTERONE  No results found for: HGBA1C  Urinalysis    Component Value Date/Time   APPEARANCEUR Clear 02/01/2017 1529   GLUCOSEU Negative 02/01/2017 1529   BILIRUBINUR Negative 02/01/2017 1529   PROTEINUR Negative 02/01/2017 1529   NITRITE Negative  02/01/2017 1529   LEUKOCYTESUR Negative 02/01/2017 1529    Pertinent Imaging: none  Assessment & Plan: I am very pleased with the patient.  Reassurance given.  I will see her as needed  1. Mixed incontinence  - Urinalysis, Complete   No follow-ups on file.  Martina Sinner, MD  [facility] [address] [place], [place] [address] [phone]

## 2018-04-24 ENCOUNTER — Other Ambulatory Visit: Payer: Self-pay | Admitting: Family Medicine

## 2018-04-24 DIAGNOSIS — Z1231 Encounter for screening mammogram for malignant neoplasm of breast: Secondary | ICD-10-CM

## 2018-04-30 ENCOUNTER — Ambulatory Visit
Admission: RE | Admit: 2018-04-30 | Discharge: 2018-04-30 | Disposition: A | Discharge status: Home / Self Care | Payer: Managed Care, Other (non HMO) | Source: Ambulatory Visit | Attending: Family Medicine | Admitting: Family Medicine

## 2018-04-30 DIAGNOSIS — Z1231 Encounter for screening mammogram for malignant neoplasm of breast: Secondary | ICD-10-CM | POA: Insufficient documentation

## 2018-06-22 ENCOUNTER — Other Ambulatory Visit: Payer: Self-pay

## 2018-06-22 ENCOUNTER — Encounter
Admission: RE | Admit: 2018-06-22 | Discharge: 2018-06-22 | Disposition: A | Discharge status: Home / Self Care | Payer: Managed Care, Other (non HMO) | Source: Ambulatory Visit | Attending: Neurosurgery | Admitting: Neurosurgery

## 2018-06-22 DIAGNOSIS — Z01818 Encounter for other preprocedural examination: Secondary | ICD-10-CM | POA: Diagnosis present

## 2018-06-22 LAB — URINALYSIS, ROUTINE W REFLEX MICROSCOPIC
Bilirubin Urine: NEGATIVE
GLUCOSE, UA: NEGATIVE mg/dL
Hgb urine dipstick: NEGATIVE
KETONES UR: NEGATIVE mg/dL
LEUKOCYTES UA: NEGATIVE
Nitrite: NEGATIVE
PH: 7 (ref 5.0–8.0)
PROTEIN: NEGATIVE mg/dL
Specific Gravity, Urine: 1.01 (ref 1.005–1.030)

## 2018-06-22 LAB — BASIC METABOLIC PANEL
Anion gap: 7 (ref 5–15)
BUN: 14 mg/dL (ref 6–20)
CALCIUM: 9.3 mg/dL (ref 8.9–10.3)
CO2: 30 mmol/L (ref 22–32)
CREATININE: 0.79 mg/dL (ref 0.44–1.00)
Chloride: 105 mmol/L (ref 98–111)
Glucose, Bld: 70 mg/dL (ref 70–99)
Potassium: 4.2 mmol/L (ref 3.5–5.1)
SODIUM: 142 mmol/L (ref 135–145)

## 2018-06-22 LAB — CBC
HEMATOCRIT: 42.2 % (ref 36.0–46.0)
Hemoglobin: 13.8 g/dL (ref 12.0–15.0)
MCH: 30.3 pg (ref 26.0–34.0)
MCHC: 32.7 g/dL (ref 30.0–36.0)
MCV: 92.5 fL (ref 80.0–100.0)
NRBC: 0 % (ref 0.0–0.2)
Platelets: 303 10*3/uL (ref 150–400)
RBC: 4.56 MIL/uL (ref 3.87–5.11)
RDW: 12.6 % (ref 11.5–15.5)
WBC: 3.8 10*3/uL — AB (ref 4.0–10.5)

## 2018-06-22 LAB — TYPE AND SCREEN
ABO/RH(D): A POS
ANTIBODY SCREEN: NEGATIVE

## 2018-06-22 LAB — PROTIME-INR
INR: 0.96
Prothrombin Time: 12.7 seconds (ref 11.4–15.2)

## 2018-06-22 LAB — APTT: APTT: 34 s (ref 24–36)

## 2018-06-22 NOTE — Patient Instructions (Signed)
  Your procedure is scheduled on: Wednesday June 27, 2018 Report to Same Day Surgery 2nd floor [facility](Medical Mall Entrance-take elevator on left to 2nd floor.  Check in with surgery information desk.) To find out your arrival time, call [phone] 1:00-3:00 PM on Tuesday June 26, 2018  Remember: Instructions that are not followed completely may result in serious medical risk, up to and including death, or upon the discretion of your surgeon and anesthesiologist your surgery may need to be rescheduled.    __x__ 1. Do not eat food (including mints, candies, chewing gum) after midnight the night before your procedure. You may drink clear liquids up to 2 hours before you are scheduled to arrive at the hospital for your procedure.  Do not drink anything within 2 hours of your scheduled arrival to the hospital.  Approved clear liquids:  --Water or Apple juice without pulp  --Clear carbohydrate beverage such as Gatorade or Powerade  --Black Coffee or Clear Tea (No milk, no creamers, do not add anything to the coffee or tea)    __x__ 2. No Alcohol for 24 hours before or after surgery.   __x__ 3. No Smoking or e-cigarettes for 24 hours before surgery.  Do not use any chewable tobacco products for at least 6 hours before surgery.   __x__ 4. Notify your doctor if there is any change in your medical condition (cold, fever, infections).   __x__ 5. On the morning of surgery brush your teeth with toothpaste and water.  You may rinse your mouth with mouthwash if you wish.  Do not swallow any toothpaste or mouthwash.  Please read over the following fact sheets that you were given:   [facility] and/or MRSA Information    __x__ Use CHG Soap or Sage wipes as directed on instruction sheet    Do not wear jewelry, make-up, hairpins, clips or nail polish on the day of surgery.  Do not wear lotions, powders, deodorant, or perfumes.   Do not shave below the face/neck 48  hours prior to surgery.   Do not bring valuables to the hospital.    [facility] is not responsible for any belongings or valuables.               Contacts, dentures or bridgework may not be worn into surgery.  For patients discharged on the day of surgery, you will NOT be permitted to drive yourself home.  You must have a responsible adult with you for 24 hours after surgery.  __x__ Take anti-hypertensive listed below, cardiac, seizure, asthma, anti-reflux and psychiatric medicines. These include:  1. Levothyroxine/Synthroid  2. Fluoxetine/Prozac  __x__ Follow recommendations from Cardiologist, Pulmonologist or PCP regarding stopping Aspirin, Coumadin, Plavix, Eliquis, Effient, Pradaxa, and Pletal.  __x__ TODAY: Stop Anti-inflammatories such as Meloxicam, Advil, Ibuprofen, Motrin, Aleve, Naproxen, Naprosyn, BC/Goodies powders or aspirin products. You may continue to take Tylenol and Celebrex.   __x__ TODAY: Stop supplements until after surgery. You may continue to take Vitamin D, Vitamin B, and multivitamin.

## 2018-06-27 ENCOUNTER — Encounter: Payer: Self-pay | Admitting: *Deleted

## 2018-06-27 ENCOUNTER — Encounter
Admission: RE | Disposition: A | Discharge status: Home / Self Care | Payer: Self-pay | Source: Ambulatory Visit | Attending: Neurosurgery

## 2018-06-27 ENCOUNTER — Ambulatory Visit: Payer: Managed Care, Other (non HMO)

## 2018-06-27 ENCOUNTER — Observation Stay
Admission: RE | Admit: 2018-06-27 | Discharge: 2018-06-28 | Disposition: A | Discharge status: Home / Self Care | Payer: Managed Care, Other (non HMO) | Source: Ambulatory Visit | Attending: Neurosurgery | Admitting: Neurosurgery

## 2018-06-27 ENCOUNTER — Other Ambulatory Visit: Payer: Self-pay

## 2018-06-27 DIAGNOSIS — R29818 Other symptoms and signs involving the nervous system: Secondary | ICD-10-CM | POA: Diagnosis present

## 2018-06-27 DIAGNOSIS — Z87891 Personal history of nicotine dependence: Secondary | ICD-10-CM | POA: Insufficient documentation

## 2018-06-27 DIAGNOSIS — E890 Postprocedural hypothyroidism: Secondary | ICD-10-CM | POA: Diagnosis not present

## 2018-06-27 DIAGNOSIS — Z820 Family history of epilepsy and other diseases of the nervous system: Secondary | ICD-10-CM | POA: Diagnosis not present

## 2018-06-27 DIAGNOSIS — Z8249 Family history of ischemic heart disease and other diseases of the circulatory system: Secondary | ICD-10-CM | POA: Insufficient documentation

## 2018-06-27 DIAGNOSIS — M48062 Spinal stenosis, lumbar region with neurogenic claudication: Secondary | ICD-10-CM | POA: Diagnosis present

## 2018-06-27 DIAGNOSIS — Z419 Encounter for procedure for purposes other than remedying health state, unspecified: Secondary | ICD-10-CM

## 2018-06-27 DIAGNOSIS — F329 Major depressive disorder, single episode, unspecified: Secondary | ICD-10-CM | POA: Insufficient documentation

## 2018-06-27 DIAGNOSIS — G9519 Other vascular myelopathies: Secondary | ICD-10-CM | POA: Diagnosis present

## 2018-06-27 DIAGNOSIS — Z79899 Other long term (current) drug therapy: Secondary | ICD-10-CM | POA: Insufficient documentation

## 2018-06-27 HISTORY — PX: LUMBAR LAMINECTOMY/DECOMPRESSION MICRODISCECTOMY: SHX5026

## 2018-06-27 LAB — ABO/RH: ABO/RH(D): A POS

## 2018-06-27 SURGERY — LUMBAR LAMINECTOMY/DECOMPRESSION MICRODISCECTOMY 1 LEVEL
Anesthesia: General | Site: Back | Laterality: Right

## 2018-06-27 MED ORDER — PROPOFOL 10 MG/ML IV BOLUS
INTRAVENOUS | Status: AC
  Filled 2018-06-27: qty 40

## 2018-06-27 MED ORDER — OXYCODONE HCL 5 MG PO TABS: 5.0000 mg | ORAL_TABLET | Freq: Once | ORAL | Status: AC | PRN

## 2018-06-27 MED ORDER — DEXMEDETOMIDINE HCL IN NACL 200 MCG/50ML IV SOLN
INTRAVENOUS | Status: AC
  Filled 2018-06-27: qty 50

## 2018-06-27 MED ORDER — ACETAMINOPHEN 500 MG PO TABS
1000.0000 mg | ORAL_TABLET | Freq: Four times a day (QID) | ORAL | Status: AC
  Administered 2018-06-27 – 2018-06-28 (×4): 1000 mg via ORAL
  Filled 2018-06-27 (×4): qty 2

## 2018-06-27 MED ORDER — ROCURONIUM BROMIDE 50 MG/5ML IV SOLN
INTRAVENOUS | Status: AC
  Filled 2018-06-27: qty 1

## 2018-06-27 MED ORDER — FENTANYL CITRATE (PF) 100 MCG/2ML IJ SOLN
INTRAMUSCULAR | Status: DC | PRN
  Administered 2018-06-27: 50 ug via INTRAVENOUS

## 2018-06-27 MED ORDER — REMIFENTANIL HCL 1 MG IV SOLR
INTRAVENOUS | Status: DC | PRN
  Administered 2018-06-27: .2 ug/kg/min via INTRAVENOUS

## 2018-06-27 MED ORDER — ONDANSETRON HCL 4 MG PO TABS: 4.0000 mg | ORAL_TABLET | Freq: Four times a day (QID) | ORAL | Status: DC | PRN

## 2018-06-27 MED ORDER — OXYCODONE HCL 5 MG/5ML PO SOLN
5.0000 mg | Freq: Once | ORAL | Status: AC | PRN
  Administered 2018-06-27: 5 mg via ORAL

## 2018-06-27 MED ORDER — FENTANYL CITRATE (PF) 100 MCG/2ML IJ SOLN
50.0000 ug | INTRAMUSCULAR | Status: AC | PRN
  Administered 2018-06-27 (×2): 50 ug via INTRAVENOUS

## 2018-06-27 MED ORDER — PROPOFOL 10 MG/ML IV BOLUS
INTRAVENOUS | Status: DC | PRN
  Administered 2018-06-27: 170 mg via INTRAVENOUS

## 2018-06-27 MED ORDER — HYDROMORPHONE HCL 1 MG/ML IJ SOLN: 0.5000 mg | INTRAMUSCULAR | Status: DC | PRN

## 2018-06-27 MED ORDER — SODIUM CHLORIDE 0.9 % IV SOLN
INTRAVENOUS | Status: DC | PRN
  Administered 2018-06-27: 40 mL

## 2018-06-27 MED ORDER — SODIUM CHLORIDE 0.9 % IV SOLN: 250.0000 mL | INTRAVENOUS | Status: DC

## 2018-06-27 MED ORDER — FENTANYL CITRATE (PF) 100 MCG/2ML IJ SOLN
INTRAMUSCULAR | Status: AC
  Filled 2018-06-27: qty 2

## 2018-06-27 MED ORDER — BUPIVACAINE-EPINEPHRINE (PF) 0.5% -1:200000 IJ SOLN
INTRAMUSCULAR | Status: DC | PRN
  Administered 2018-06-27: 10 mL

## 2018-06-27 MED ORDER — SODIUM CHLORIDE 0.9 % IV SOLN
INTRAVENOUS | Status: DC | PRN
  Administered 2018-06-27: 25 ug/min via INTRAVENOUS

## 2018-06-27 MED ORDER — SENNOSIDES-DOCUSATE SODIUM 8.6-50 MG PO TABS: 1.0000 | ORAL_TABLET | Freq: Every evening | ORAL | Status: DC | PRN

## 2018-06-27 MED ORDER — KETAMINE HCL 50 MG/ML IJ SOLN
INTRAMUSCULAR | Status: DC | PRN
  Administered 2018-06-27: 30 mg via INTRAVENOUS
  Administered 2018-06-27: 20 mg via INTRAMUSCULAR

## 2018-06-27 MED ORDER — OXYCODONE HCL 5 MG PO TABS
5.0000 mg | ORAL_TABLET | ORAL | Status: DC | PRN
  Administered 2018-06-27: 5 mg via ORAL
  Filled 2018-06-27: qty 1

## 2018-06-27 MED ORDER — FAMOTIDINE 20 MG PO TABS
20.0000 mg | ORAL_TABLET | Freq: Once | ORAL | Status: AC
  Administered 2018-06-27: 20 mg via ORAL

## 2018-06-27 MED ORDER — REMIFENTANIL HCL 1 MG IV SOLR
INTRAVENOUS | Status: AC
  Filled 2018-06-27: qty 1000

## 2018-06-27 MED ORDER — PHENOL 1.4 % MT LIQD
1.0000 | OROMUCOSAL | Status: DC | PRN
  Filled 2018-06-27: qty 177

## 2018-06-27 MED ORDER — SUGAMMADEX SODIUM 200 MG/2ML IV SOLN
INTRAVENOUS | Status: DC | PRN
  Administered 2018-06-27: 172.4 mg via INTRAVENOUS

## 2018-06-27 MED ORDER — ROCURONIUM BROMIDE 100 MG/10ML IV SOLN
INTRAVENOUS | Status: DC | PRN
  Administered 2018-06-27: 15 mg via INTRAVENOUS
  Administered 2018-06-27: 5 mg via INTRAVENOUS

## 2018-06-27 MED ORDER — GLYCOPYRROLATE 0.2 MG/ML IJ SOLN
INTRAMUSCULAR | Status: AC
  Filled 2018-06-27: qty 1

## 2018-06-27 MED ORDER — PHENYLEPHRINE HCL 10 MG/ML IJ SOLN
INTRAMUSCULAR | Status: AC
  Filled 2018-06-27: qty 1

## 2018-06-27 MED ORDER — SODIUM CHLORIDE 0.9 % IR SOLN
Status: DC | PRN
  Administered 2018-06-27: 1000 mL

## 2018-06-27 MED ORDER — MENTHOL 3 MG MT LOZG
1.0000 | LOZENGE | OROMUCOSAL | Status: DC | PRN
  Filled 2018-06-27: qty 9

## 2018-06-27 MED ORDER — MIDAZOLAM HCL 2 MG/2ML IJ SOLN
INTRAMUSCULAR | Status: DC | PRN
  Administered 2018-06-27: 2 mg via INTRAVENOUS

## 2018-06-27 MED ORDER — CEFAZOLIN SODIUM-DEXTROSE 2-4 GM/100ML-% IV SOLN
2.0000 g | INTRAVENOUS | Status: AC
  Administered 2018-06-27: 2 g via INTRAVENOUS

## 2018-06-27 MED ORDER — BUPIVACAINE HCL 0.5 % IJ SOLN
INTRAMUSCULAR | Status: DC | PRN
  Administered 2018-06-27: 20 mL

## 2018-06-27 MED ORDER — SODIUM CHLORIDE FLUSH 0.9 % IV SOLN
INTRAVENOUS | Status: AC
  Filled 2018-06-27: qty 10

## 2018-06-27 MED ORDER — SODIUM CHLORIDE (PF) 0.9 % IJ SOLN
INTRAMUSCULAR | Status: AC
  Filled 2018-06-27: qty 20

## 2018-06-27 MED ORDER — CEFAZOLIN SODIUM-DEXTROSE 2-4 GM/100ML-% IV SOLN
INTRAVENOUS | Status: AC
  Filled 2018-06-27: qty 100

## 2018-06-27 MED ORDER — METHOCARBAMOL 1000 MG/10ML IJ SOLN
500.0000 mg | Freq: Four times a day (QID) | INTRAVENOUS | Status: DC | PRN
  Filled 2018-06-27: qty 5

## 2018-06-27 MED ORDER — DEXAMETHASONE SODIUM PHOSPHATE 10 MG/ML IJ SOLN
INTRAMUSCULAR | Status: AC
  Filled 2018-06-27: qty 1

## 2018-06-27 MED ORDER — EPHEDRINE SULFATE 50 MG/ML IJ SOLN
INTRAMUSCULAR | Status: AC
  Filled 2018-06-27: qty 1

## 2018-06-27 MED ORDER — METHYLPREDNISOLONE ACETATE 40 MG/ML IJ SUSP
INTRAMUSCULAR | Status: DC | PRN
  Administered 2018-06-27: 40 mg

## 2018-06-27 MED ORDER — GLYCOPYRROLATE 0.2 MG/ML IJ SOLN
INTRAMUSCULAR | Status: DC | PRN
  Administered 2018-06-27: 0.2 mg via INTRAVENOUS

## 2018-06-27 MED ORDER — SODIUM CHLORIDE 0.9 % IV SOLN
INTRAVENOUS | Status: DC
  Administered 2018-06-27 (×2): via INTRAVENOUS

## 2018-06-27 MED ORDER — BACITRACIN 50000 UNITS IM SOLR
INTRAMUSCULAR | Status: AC
  Filled 2018-06-27: qty 1

## 2018-06-27 MED ORDER — EVICEL 2 ML EX KIT
PACK | CUTANEOUS | Status: AC
  Filled 2018-06-27: qty 1

## 2018-06-27 MED ORDER — SODIUM CHLORIDE 0.9% FLUSH: 3.0000 mL | INTRAVENOUS | Status: DC | PRN

## 2018-06-27 MED ORDER — OXYCODONE HCL 5 MG PO TABS
10.0000 mg | ORAL_TABLET | ORAL | Status: DC | PRN
  Administered 2018-06-27 (×2): 10 mg via ORAL
  Filled 2018-06-27 (×2): qty 2

## 2018-06-27 MED ORDER — ONDANSETRON HCL 4 MG/2ML IJ SOLN
INTRAMUSCULAR | Status: DC | PRN
  Administered 2018-06-27: 4 mg via INTRAVENOUS

## 2018-06-27 MED ORDER — THROMBIN 5000 UNITS EX SOLR
CUTANEOUS | Status: DC | PRN
  Administered 2018-06-27: 5000 [IU] via TOPICAL

## 2018-06-27 MED ORDER — LIDOCAINE HCL (CARDIAC) PF 100 MG/5ML IV SOSY
PREFILLED_SYRINGE | INTRAVENOUS | Status: DC | PRN
  Administered 2018-06-27: 100 mg via INTRAVENOUS

## 2018-06-27 MED ORDER — DEXMEDETOMIDINE HCL 200 MCG/2ML IV SOLN
INTRAVENOUS | Status: DC | PRN
  Administered 2018-06-27 (×2): 4 ug via INTRAVENOUS

## 2018-06-27 MED ORDER — THROMBIN 5000 UNITS EX SOLR
CUTANEOUS | Status: AC
  Filled 2018-06-27: qty 5000

## 2018-06-27 MED ORDER — BUPIVACAINE LIPOSOME 1.3 % IJ SUSP
INTRAMUSCULAR | Status: AC
  Filled 2018-06-27: qty 20

## 2018-06-27 MED ORDER — MIDAZOLAM HCL 2 MG/2ML IJ SOLN
INTRAMUSCULAR | Status: AC
  Filled 2018-06-27: qty 2

## 2018-06-27 MED ORDER — FENTANYL CITRATE (PF) 100 MCG/2ML IJ SOLN
25.0000 ug | INTRAMUSCULAR | Status: AC | PRN
  Administered 2018-06-27 (×6): 25 ug via INTRAVENOUS

## 2018-06-27 MED ORDER — VANCOMYCIN HCL 1000 MG IV SOLR
INTRAVENOUS | Status: AC
  Filled 2018-06-27: qty 1000

## 2018-06-27 MED ORDER — METHYLPREDNISOLONE ACETATE 40 MG/ML IJ SUSP
INTRAMUSCULAR | Status: AC
  Filled 2018-06-27: qty 1

## 2018-06-27 MED ORDER — PHENYLEPHRINE HCL 10 MG/ML IJ SOLN
INTRAMUSCULAR | Status: DC | PRN
  Administered 2018-06-27: 100 ug via INTRAVENOUS

## 2018-06-27 MED ORDER — DEXAMETHASONE SODIUM PHOSPHATE 10 MG/ML IJ SOLN
INTRAMUSCULAR | Status: DC | PRN
  Administered 2018-06-27: 10 mg via INTRAVENOUS

## 2018-06-27 MED ORDER — ACETAMINOPHEN 650 MG RE SUPP: 650.0000 mg | RECTAL | Status: DC | PRN

## 2018-06-27 MED ORDER — ACETAMINOPHEN 325 MG PO TABS
650.0000 mg | ORAL_TABLET | ORAL | Status: DC | PRN
  Administered 2018-06-28: 650 mg via ORAL
  Filled 2018-06-27: qty 2

## 2018-06-27 MED ORDER — ONDANSETRON HCL 4 MG/2ML IJ SOLN: 4.0000 mg | Freq: Four times a day (QID) | INTRAMUSCULAR | Status: DC | PRN

## 2018-06-27 MED ORDER — LIDOCAINE HCL (PF) 2 % IJ SOLN
INTRAMUSCULAR | Status: AC
  Filled 2018-06-27: qty 10

## 2018-06-27 MED ORDER — SUGAMMADEX SODIUM 200 MG/2ML IV SOLN
INTRAVENOUS | Status: AC
  Filled 2018-06-27: qty 2

## 2018-06-27 MED ORDER — OXYCODONE HCL 5 MG/5ML PO SOLN
ORAL | Status: AC
  Filled 2018-06-27: qty 5

## 2018-06-27 MED ORDER — SODIUM CHLORIDE (PF) 0.9 % IJ SOLN
INTRAMUSCULAR | Status: AC
  Filled 2018-06-27: qty 10

## 2018-06-27 MED ORDER — LACTATED RINGERS IV SOLN
INTRAVENOUS | Status: DC
  Administered 2018-06-27: 07:00:00 via INTRAVENOUS

## 2018-06-27 MED ORDER — ONDANSETRON HCL 4 MG/2ML IJ SOLN
INTRAMUSCULAR | Status: AC
  Filled 2018-06-27: qty 2

## 2018-06-27 MED ORDER — SUCCINYLCHOLINE CHLORIDE 20 MG/ML IJ SOLN
INTRAMUSCULAR | Status: DC | PRN
  Administered 2018-06-27: 100 mg via INTRAVENOUS

## 2018-06-27 MED ORDER — SODIUM CHLORIDE 0.9% FLUSH
3.0000 mL | Freq: Two times a day (BID) | INTRAVENOUS | Status: DC
  Administered 2018-06-27 – 2018-06-28 (×3): 3 mL via INTRAVENOUS

## 2018-06-27 MED ORDER — FLUOXETINE HCL 20 MG PO CAPS
20.0000 mg | ORAL_CAPSULE | Freq: Every day | ORAL | Status: DC
  Administered 2018-06-28: 20 mg via ORAL
  Filled 2018-06-27 (×2): qty 1

## 2018-06-27 MED ORDER — GABAPENTIN 100 MG PO CAPS
200.0000 mg | ORAL_CAPSULE | Freq: Every day | ORAL | Status: DC
  Administered 2018-06-27: 200 mg via ORAL
  Filled 2018-06-27: qty 2

## 2018-06-27 MED ORDER — METHOCARBAMOL 500 MG PO TABS: 500.0000 mg | ORAL_TABLET | Freq: Four times a day (QID) | ORAL | Status: DC | PRN

## 2018-06-27 MED ORDER — LEVOTHYROXINE SODIUM 88 MCG PO TABS
88.0000 ug | ORAL_TABLET | Freq: Every day | ORAL | Status: DC
  Administered 2018-06-28: 88 ug via ORAL
  Filled 2018-06-27: qty 1

## 2018-06-27 MED ORDER — LORATADINE 10 MG PO TABS
10.0000 mg | ORAL_TABLET | Freq: Every day | ORAL | Status: DC
  Filled 2018-06-27: qty 1

## 2018-06-27 MED ORDER — FAMOTIDINE 20 MG PO TABS
ORAL_TABLET | ORAL | Status: AC
  Filled 2018-06-27: qty 1

## 2018-06-27 MED ORDER — BUPIVACAINE HCL (PF) 0.5 % IJ SOLN
INTRAMUSCULAR | Status: AC
  Filled 2018-06-27: qty 30

## 2018-06-27 MED ORDER — BUPIVACAINE-EPINEPHRINE (PF) 0.5% -1:200000 IJ SOLN
INTRAMUSCULAR | Status: AC
  Filled 2018-06-27: qty 30

## 2018-06-27 MED ORDER — KETAMINE HCL 50 MG/ML IJ SOLN
INTRAMUSCULAR | Status: AC
  Filled 2018-06-27: qty 10

## 2018-06-27 SURGICAL SUPPLY — 58 items
BUR NEURO DRILL SOFT 3.0X3.8M (BURR) ×3 IMPLANT
CANISTER SUCT 1200ML W/VALVE (MISCELLANEOUS) ×6 IMPLANT
CHLORAPREP W/TINT 26ML (MISCELLANEOUS) ×6 IMPLANT
CNTNR SPEC 2.5X3XGRAD LEK (MISCELLANEOUS) ×1
CONT SPEC 4OZ STER OR WHT (MISCELLANEOUS) ×2
CONTAINER SPEC 2.5X3XGRAD LEK (MISCELLANEOUS) ×1 IMPLANT
COUNTER NEEDLE 20/40 LG (NEEDLE) ×3 IMPLANT
COVER LIGHT HANDLE STERIS (MISCELLANEOUS) ×6 IMPLANT
COVER WAND RF STERILE (DRAPES) ×3 IMPLANT
CUP MEDICINE 2OZ PLAST GRAD ST (MISCELLANEOUS) ×6 IMPLANT
DERMABOND ADVANCED (GAUZE/BANDAGES/DRESSINGS) ×2
DERMABOND ADVANCED .7 DNX12 (GAUZE/BANDAGES/DRESSINGS) ×1 IMPLANT
DRAPE C-ARM 42X72 X-RAY (DRAPES) ×6 IMPLANT
DRAPE LAPAROTOMY 100X77 ABD (DRAPES) ×3 IMPLANT
DRAPE MICROSCOPE SPINE 48X150 (DRAPES) ×3 IMPLANT
DRAPE POUCH INSTRU U-SHP 10X18 (DRAPES) ×3 IMPLANT
DRAPE SURG 17X11 SM STRL (DRAPES) ×12 IMPLANT
ELECT CAUTERY BLADE TIP 2.5 (TIP) ×3
ELECT EZSTD 165MM 6.5IN (MISCELLANEOUS)
ELECT REM PT RETURN 9FT ADLT (ELECTROSURGICAL) ×3
ELECTRODE CAUTERY BLDE TIP 2.5 (TIP) ×1 IMPLANT
ELECTRODE EZSTD 165MM 6.5IN (MISCELLANEOUS) IMPLANT
ELECTRODE REM PT RTRN 9FT ADLT (ELECTROSURGICAL) ×1 IMPLANT
FRAME EYE SHIELD (PROTECTIVE WEAR) ×6 IMPLANT
GLOVE BIOGEL PI IND STRL 7.0 (GLOVE) ×1 IMPLANT
GLOVE BIOGEL PI INDICATOR 7.0 (GLOVE) ×2
GLOVE SURG SYN 7.0 (GLOVE) ×6 IMPLANT
GLOVE SURG SYN 8.5  E (GLOVE) ×6
GLOVE SURG SYN 8.5 E (GLOVE) ×3 IMPLANT
GOWN SRG XL LVL 3 NONREINFORCE (GOWNS) ×1 IMPLANT
GOWN STRL NON-REIN TWL XL LVL3 (GOWNS) ×2
GOWN STRL REUS W/TWL MED LVL3 (GOWN DISPOSABLE) ×3 IMPLANT
GRADUATE 1200CC STRL 31836 (MISCELLANEOUS) ×3 IMPLANT
GRAFT DURAGEN MATRIX 1WX1L (Tissue) ×3 IMPLANT
KIT SPINAL PRONEVIEW (KITS) ×3 IMPLANT
KNIFE BAYONET SHORT DISCETOMY (MISCELLANEOUS) IMPLANT
MARKER SKIN DUAL TIP RULER LAB (MISCELLANEOUS) ×3 IMPLANT
NDL SAFETY ECLIPSE 18X1.5 (NEEDLE) ×1 IMPLANT
NEEDLE HYPO 18GX1.5 SHARP (NEEDLE) ×2
NEEDLE HYPO 22GX1.5 SAFETY (NEEDLE) ×3 IMPLANT
NS IRRIG 1000ML POUR BTL (IV SOLUTION) ×3 IMPLANT
PACK LAMINECTOMY NEURO (CUSTOM PROCEDURE TRAY) ×3 IMPLANT
PAD ARMBOARD 7.5X6 YLW CONV (MISCELLANEOUS) ×3 IMPLANT
SPOGE SURGIFLO 8M (HEMOSTASIS) ×2
SPONGE SURGIFLO 8M (HEMOSTASIS) ×1 IMPLANT
SUT DVC VLOC 3-0 CL 6 P-12 (SUTURE) ×3 IMPLANT
SUT VIC AB 0 CT1 27 (SUTURE) ×2
SUT VIC AB 0 CT1 27XCR 8 STRN (SUTURE) ×1 IMPLANT
SUT VIC AB 2-0 CT1 18 (SUTURE) ×3 IMPLANT
SYR 10ML LL (SYRINGE) ×3 IMPLANT
SYR 20CC LL (SYRINGE) ×3 IMPLANT
SYR 30ML LL (SYRINGE) ×6 IMPLANT
SYR 3ML LL SCALE MARK (SYRINGE) ×3 IMPLANT
TOWEL OR 17X26 4PK STRL BLUE (TOWEL DISPOSABLE) ×9 IMPLANT
TUBE MATRX SPINL 18MM 7CM DISP (INSTRUMENTS) ×2
TUBE METRX SPINAL 18X7 DISP (INSTRUMENTS) ×1 IMPLANT
TUBING CONNECTING 10 (TUBING) ×2 IMPLANT
TUBING CONNECTING 10' (TUBING) ×1

## 2018-06-27 NOTE — Anesthesia Procedure Notes (Signed)
Procedure Name: Intubation Date/Time: 06/27/2018 7:23 AM Performed by: Rolla Plate, CRNA Pre-anesthesia Checklist: Patient identified, Patient being monitored, Timeout performed, Emergency Drugs available and Suction available Patient Re-evaluated:Patient Re-evaluated prior to induction Oxygen Delivery Method: Circle system utilized Preoxygenation: Pre-oxygenation with 100% oxygen Induction Type: IV induction Ventilation: Mask ventilation without difficulty Laryngoscope Size: Mac and 3 Grade View: Grade I Tube type: Oral Tube size: 7.0 mm Number of attempts: 1 Airway Equipment and Method: Stylet Placement Confirmation: ETT inserted through vocal cords under direct vision,  positive ETCO2 and breath sounds checked- equal and bilateral Secured at: 21 cm Tube secured with: Tape Dental Injury: Teeth and Oropharynx as per pre-operative assessment

## 2018-06-27 NOTE — Op Note (Addendum)
Indications: 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  She failed conservative management and elected for surgical intervention.  Findings: lumbar stenosis at L4-5  Preoperative Diagnosis: Lumbar Stenosis with neurogenic claudication Postoperative Diagnosis: same   EBL: 25 ml IVF: 700 ml Drains: none Disposition: Extubated and Stable to PACU Complications: none  No foley catheter was placed.   Preoperative Note:   Risks of surgery discussed include: infection, bleeding, stroke, coma, death, paralysis, CSF leak, nerve/spinal cord injury, numbness, tingling, weakness, complex regional pain syndrome, recurrent stenosis and/or disc herniation, vascular injury, development of instability, neck/back pain, need for further surgery, persistent symptoms, development of deformity, and the risks of anesthesia. The patient understood these risks and agreed to proceed.  Operative Note:   1. L4-5 lumbar decompression including central laminectomy and bilateral medial facetectomies including foraminotomies  The patient was then brought from the preoperative center with intravenous access established.  The patient underwent general anesthesia and endotracheal tube intubation, and was then rotated on the [place] rail top where all pressure points were appropriately padded.  The skin was then thoroughly cleansed.  Perioperative antibiotic prophylaxis was administered.  Sterile prep and drapes were then applied and a timeout was then observed.  C-arm was brought into the field under sterile conditions and under lateral visualization the L4-5 interspace was identified and marked.  The incision was marked on the right and injected with local anesthetic. Once this was complete a 2 cm incision was opened with the use of a #10 blade knife.    The metrx tubes were sequentially advanced and confirmed in position at L4-5. An 18mm by 70mm tube was  locked in place to the bed side attachment.  The microscope was then sterilely brought into the field and muscle creep was hemostased with a bipolar and resected with a pituitary rongeur.  A Bovie extender was then used to expose the spinous process and lamina.  Careful attention was placed to not violate the facet capsule. A 3 mm matchstick drill bit was then used to make a hemi-laminotomy trough until the ligamentum flavum was exposed.  This was extended to the base of the spinous process and to the contralateral side to remove all the central bone from each side.  Once this was complete and the underlying ligamentum flavum was visualized, it was dissected with a curette and resected with Kerrison rongeurs.  Extensive ligamentum hypertrophy was noted, requiring a substantial amount of time and care for removal.  The dura was identified and palpated. The kerrison rongeur was then used to remove the medial facet bilaterally until no compression was noted.    A small area of soft tissue consistent with disc herniation was noted below the L5 nerve root on the R.  This was palpated and felt to be compressive.  The disc herniation was entered with the small Penfield 4, then dissected free and the extruded fragment removed.  The nerve root was then palpated. A balltip probe was used to confirm decompression of the ipsilateral L5 nerve root.  Additional attention was paid to completion of the contralateral L4-5 foraminotomy until the contralateral traversing nerve root was completely free.  Once this was complete, L4-5 central decompression including medial facetectomy and foraminotomy was confirmed and decompression on both sides was confirmed.   During removal of the caudal portion of the ligamentum flavum attachment to the leading edge of L5 in the dorsal midline, a small linear tear  in the dura was noted, without substantial egress of CSF.  This was carefully protected for the remainder of the case.    A  Depo-Medrol soaked Gelfoam pledget was placed in the defect.  The wound was copiously irrigated. Duragen and tisseel were used to augment the closure of the dural defect.  The tube system was then removed under microscopic visualization and hemostasis was obtained with a bipolar.    The fascial layer was reapproximated with the use of a 0 Vicryl suture.  Subcutaneous tissue layer was reapproximated using 2-0 Vicryl suture.  3-0 monocryl was placed in subcuticular fashion. The skin was then cleansed and Dermabond was used to close the skin opening.  Patient was then rotated back to the preoperative bed awakened from anesthesia and taken to recovery all counts are correct in this case.  I performed the entire procedure with the assistance of Ivar Drape PA as an Designer, television/film set.  Deshannon Hinchliffe K. Myer Haff MD  Due to the presence of a dural defect, a decision was made for observation overnight with bedrest for 24 hours.

## 2018-06-27 NOTE — H&P (Signed)
I have reviewed and confirmed my history and physical from 06/27/2018 with no additions or changes. Plan for L4-5 decompression with possible discectomy.  Risks and benefits reviewed.    Heart sounds normal no MRG. Chest Clear to Auscultation Bilaterally.

## 2018-06-27 NOTE — Anesthesia Post-op Follow-up Note (Signed)
Anesthesia QCDR form completed.        

## 2018-06-27 NOTE — Discharge Instructions (Signed)

## 2018-06-27 NOTE — Anesthesia Preprocedure Evaluation (Signed)
Anesthesia Evaluation  Patient identified by MRN, date of birth, ID band Patient awake    Reviewed: Allergy & Precautions, H&P , NPO status , Patient's Chart, lab work & pertinent test results  History of Anesthesia Complications Negative for: history of anesthetic complications  Airway Mallampati: III  TM Distance: <3 FB Neck ROM: full    Dental  (+) Chipped   Pulmonary neg shortness of breath, former smoker,           Cardiovascular Exercise Tolerance: Good (-) angina(-) Past MI and (-) DOE negative cardio ROS       Neuro/Psych negative neurological ROS  negative psych ROS   GI/Hepatic negative GI ROS, Neg liver ROS, neg GERD  ,  Endo/Other  Hypothyroidism   Renal/GU      Musculoskeletal   Abdominal   Peds  Hematology negative hematology ROS (+)   Anesthesia Other Findings Past Medical History: No date: Depression No date: Hypothyroidism No date: Stress incontinence No date: Thyroid disease  Past Surgical History: No date: COLONOSCOPY 08/21/2017: CYSTOSCOPY; N/A     Comment:  Procedure: CYSTOSCOPY;  Surgeon: Alfredo MartinezMacDiarmid, Scott, MD;               Location: [facility] ORS;  Service: Urology;  Laterality: N/A; 08/21/2017: PUBOVAGINAL SLING; N/A     Comment:  Procedure: Leonides GrillsPUBO-VAGINAL SLING;  Surgeon: Alfredo MartinezMacDiarmid,               Scott, MD;  Location: [facility] ORS;  Service: Urology;                Laterality: N/A; 1989: THYROIDECTOMY  BMI    Body Mass Index:  32.61 kg/m      Reproductive/Obstetrics negative OB ROS                             Anesthesia Physical Anesthesia Plan  ASA: III  Anesthesia Plan: General ETT   Post-op Pain Management:    Induction: Intravenous  PONV Risk Score and Plan: Ondansetron, Dexamethasone, Midazolam and Treatment may vary due to age or medical condition  Airway Management Planned: Oral ETT  Additional Equipment:   Intra-op Plan:    Post-operative Plan: Extubation in OR  Informed Consent: I have reviewed the patients History and Physical, chart, labs and discussed the procedure including the risks, benefits and alternatives for the proposed anesthesia with the patient or authorized representative who has indicated his/her understanding and acceptance.   Dental Advisory Given  Plan Discussed with: Anesthesiologist, CRNA and Surgeon  Anesthesia Plan Comments: (Patient consented for risks of anesthesia including but not limited to:  - adverse reactions to medications - damage to teeth, lips or other oral mucosa - sore throat or hoarseness - Damage to heart, brain, lungs or loss of life  Patient voiced understanding.)        Anesthesia Quick Evaluation

## 2018-06-27 NOTE — Transfer of Care (Signed)
Immediate Anesthesia Transfer of Care Note  Patient: Abigail West  Procedure(s) Performed: LUMBAR LAMINECTOMY/DECOMPRESSION MICRODISCECTOMY 1 LEVEL-L4-5 (Right Back)  Patient Location: PACU  Anesthesia Type:General  Level of Consciousness: awake  Airway & Oxygen Therapy: Patient Spontanous Breathing and Patient connected to face mask oxygen  Post-op Assessment: Report given to RN and Post -op Vital signs reviewed and stable  Post vital signs: Reviewed  Last Vitals:  Vitals Value Taken Time  BP 142/68 06/27/2018  9:21 AM  Temp    Pulse 86 06/27/2018  9:21 AM  Resp 14 06/27/2018  9:21 AM  SpO2 100 % 06/27/2018  9:21 AM  Vitals shown include unvalidated device data.  Last Pain:  Vitals:   06/27/18 0618  TempSrc: Tympanic  PainSc: 8          Complications: No apparent anesthesia complications

## 2018-06-27 NOTE — Discharge Summary (Addendum)
Procedure: Lumbar decompression L4-5 Procedure date: 06/27/2018 Diagnosis: Neurogenic claudication  History:  POD1: Patient has not been able to get up due to remain supine orders. Once orders were lifted, she was able to ambulate without issue. Leg symptoms are resolved at this time. Back pain persists, but is tolerable. Denies any new lower extremity pain, numbness, tingling, or weakness. Denies headache.    POD0:  status post lumbar decompression L4-L5 for neurogenic claudication. Evaluated while still disoriented from anesthesia but able to answer questions and obey commands. Denies any lower extremity pain/numbness/tingling at this time. Back pain 7/10.  Physical Exam: Vitals:   06/27/18 0618  BP: 127/74  Pulse: 65  Resp: 16  Temp: 97.7 F (36.5 C)  SpO2: 100%    AA Ox3 Strength:5/5 throughout upper and lower extremities Sensation: Intact and symmetric throughout upper and lower extremities.   Data:  Recent Labs  Lab 06/22/18 1119  NA 142  K 4.2  CL 105  CO2 30  BUN 14  CREATININE 0.79  GLUCOSE 70  CALCIUM 9.3   No results for input(s): AST, ALT, ALKPHOS in the last 168 hours.  Invalid input(s): TBILI   Recent Labs  Lab 06/22/18 1119  WBC 3.8*  HGB 13.8  HCT 42.2  PLT 303   Recent Labs  Lab 06/22/18 1119  APTT 34  INR 0.96         Other tests/results: No imaging reviewed  Assessment/Plan:  Abigail West is POD1 status post L4-5 lumbar decompression for neurogenic claudication.  Recovering well. Will continue pain control with tylenol, oxycodone, and robaxin as needed. Discussed wound care and activity restrictions. All questions and concerns addressed. She is scheduled to follow up in 2 weeks to monitor progress.   Ivar DrapeAmanda Larinda Herter PA-C Department of Neurosurgery

## 2018-06-28 ENCOUNTER — Encounter: Payer: Self-pay | Admitting: Neurosurgery

## 2018-06-28 DIAGNOSIS — M48062 Spinal stenosis, lumbar region with neurogenic claudication: Secondary | ICD-10-CM | POA: Diagnosis not present

## 2018-06-28 MED ORDER — METHOCARBAMOL 500 MG PO TABS: 500.0000 mg | ORAL_TABLET | Freq: Four times a day (QID) | ORAL | 0 refills | Status: DC | PRN

## 2018-06-28 MED ORDER — OXYCODONE HCL 5 MG PO TABS: 5.0000 mg | ORAL_TABLET | ORAL | 0 refills | Status: DC | PRN

## 2018-06-28 NOTE — Progress Notes (Signed)
Pt is being discharged to home. Pt is AOx4, VSS, she does not c/o pain just mild soreness at the surgical site and she does not show any signs of distress. The pt's husband is at the bedside and will be taking her home. AVS and prescription was given and explained to the pt and the husband, they each verbalized understanding of the AVS, prescription and discharge plan of care.

## 2018-06-28 NOTE — Care Management (Signed)
RNCM met with patient and her husband regarding discharge planning. Patient states she is wet with urination and "didn't want to bother anyone".  She states she hopes to return home after a "sponge bath".  She has not been out of bed yet.  She is independent at baseline.   Home health list provided. RNCM will follow

## 2018-06-28 NOTE — Progress Notes (Signed)
Pt has ambulated in hallway with front wheel walker without any complications. We will continue to monitor the pt.

## 2018-06-28 NOTE — Anesthesia Postprocedure Evaluation (Signed)
Anesthesia Post Note  Patient: Abigail West Single  Procedure(s) Performed: LUMBAR LAMINECTOMY/DECOMPRESSION MICRODISCECTOMY 1 LEVEL-L4-5 (Right Back)  Patient location during evaluation: PACU Anesthesia Type: General Level of consciousness: awake and alert and oriented Pain management: pain level controlled Vital Signs Assessment: post-procedure vital signs reviewed and stable Respiratory status: spontaneous breathing, nonlabored ventilation and respiratory function stable Cardiovascular status: blood pressure returned to baseline and stable Postop Assessment: no signs of nausea or vomiting Anesthetic complications: no     Last Vitals:  Vitals:   06/28/18 0510 06/28/18 0742  BP: (!) 113/58 120/65  Pulse: (!) 57 (!) 57  Resp: 18 18  Temp: 36.6 C 36.8 C  SpO2: 100% 100%    Last Pain:  Vitals:   06/28/18 0742  TempSrc: Oral  PainSc:                  Abigail West

## 2018-06-28 NOTE — Progress Notes (Signed)
Procedure: Lumbar decompression L4-5 Procedure date: 06/27/2018 Diagnosis: Neurogenic claudication  History:  POD1: Recovering well. Complains of pain at incision site 4/10. She has not been out of bed since surgery due to orders to lay flat. Using external catheter for voiding. No saddle paresthesia. Denies any lower extremity pain/numbness/tingling but symptoms usually present when weight bearing.  Eating without issue.    POD0: status post lumbar decompression L4-L5 for neurogenic claudication. Evaluated while still disoriented from anesthesia but able to answer questions and obey commands. Denies any lower extremity pain/numbness/tingling at this time. Back pain 7/10.  Physical Exam: Vitals:   06/28/18 0510 06/28/18 0742  BP: (!) 113/58 120/65  Pulse: (!) 57 (!) 57  Resp: 18 18  Temp: 97.8 F (36.6 C) 98.3 F (36.8 C)  SpO2: 100% 100%    AA Ox3 Strength:5/5 throughout upper and lower extremities Sensation: Intact and symmetric throughout upper and lower extremities.   Data:  Recent Labs  Lab 06/22/18 1119  NA 142  K 4.2  CL 105  CO2 30  BUN 14  CREATININE 0.79  GLUCOSE 70  CALCIUM 9.3   No results for input(s): AST, ALT, ALKPHOS in the last 168 hours.  Invalid input(s): TBILI   Recent Labs  Lab 06/22/18 1119  WBC 3.8*  HGB 13.8  HCT 42.2  PLT 303   Recent Labs  Lab 06/22/18 1119  APTT 34  INR 0.96         Other tests/results: No imaging reviewed  Assessment/Plan:  Abigail West is POD 1 status post L4-5 lumbar decompression for neurogenic claudication.  Dr. Myer HaffYarbrough has advised that patient may get up after 9AM. Will assess at that time to determine if symptoms have resolved.  -mobilize -pain control  Ivar DrapeAmanda Magdala Brahmbhatt PA-C Department of Neurosurgery

## 2018-06-28 NOTE — Care Management (Signed)
RNCM spoke with patient and husband regarding front-wheeled walker. Husband states that she ambulated without one and will be fine without one.  No RNCM needs.

## 2018-07-10 ENCOUNTER — Encounter: Payer: Self-pay | Admitting: Emergency Medicine

## 2018-07-10 ENCOUNTER — Other Ambulatory Visit: Payer: Self-pay

## 2018-07-10 ENCOUNTER — Ambulatory Visit
Admission: EM | Admit: 2018-07-10 | Discharge: 2018-07-10 | Disposition: A | Discharge status: Home / Self Care | Payer: Managed Care, Other (non HMO) | Attending: Family Medicine | Admitting: Family Medicine

## 2018-07-10 DIAGNOSIS — N3001 Acute cystitis with hematuria: Secondary | ICD-10-CM | POA: Diagnosis not present

## 2018-07-10 LAB — URINALYSIS, COMPLETE (UACMP) WITH MICROSCOPIC
Glucose, UA: NEGATIVE mg/dL
Nitrite: POSITIVE — AB
PH: 5.5 (ref 5.0–8.0)
PROTEIN: 100 mg/dL — AB
Specific Gravity, Urine: 1.02 (ref 1.005–1.030)

## 2018-07-10 MED ORDER — PHENAZOPYRIDINE HCL 200 MG PO TABS: 200.0000 mg | ORAL_TABLET | Freq: Three times a day (TID) | ORAL | 0 refills | Status: DC

## 2018-07-10 MED ORDER — CEPHALEXIN 500 MG PO CAPS: 500.0000 mg | ORAL_CAPSULE | Freq: Two times a day (BID) | ORAL | 0 refills | Status: DC

## 2018-07-10 NOTE — ED Triage Notes (Addendum)
Patient in today c/o urinary urgency/pressure x 4 days. Patient has tried OTC Azo. Patient had back surgery 06/27/09 and had an external urinary catheter over night.  Patient also c/o headache x 2 days. Patient has been taking Tylenol.

## 2018-07-10 NOTE — Discharge Instructions (Addendum)
Plenty of fluids

## 2018-07-10 NOTE — ED Provider Notes (Signed)
[facility]    CSN: [id number] Arrival date & time: 07/10/18  [id number]     History   Chief Complaint Chief Complaint  Patient presents with  . Urinary Urgency    HPI Abigail West is a 57 y.o. female.   HPI  57 year old female presents of urinary urgency and pressure that she has had for 4 days.  She has had  insatiety and urgency but denies specific dysuria .  She denies fever or chills has not had nausea or vomiting.  She is afebrile today.  Had back surgery on 06/27/2009 and had an external urinary catheter overnight that was placed between the labia and vacuum vented urine away.  He denies any vaginal discharge.         Past Medical History:  Diagnosis Date  . Depression   . Hypothyroidism   . Stress incontinence   . Thyroid disease     Patient Active Problem List   Diagnosis Date Noted  . 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 COLONOSCOPY    . CYSTOSCOPY N/A 08/21/2017   Procedure: CYSTOSCOPY;  Surgeon: Alfredo MartinezMacDiarmid, Scott, MD;  Location: [facility];  Service: Urology;  Laterality: N/A;  . LUMBAR LAMINECTOMY/DECOMPRESSION MICRODISCECTOMY Right 06/27/2018   Procedure: LUMBAR LAMINECTOMY/DECOMPRESSION MICRODISCECTOMY 1 LEVEL-L4-5;  Surgeon: Venetia NightYarbrough, Chester, MD;  Location: [facility];  Service: Neurosurgery;  Laterality: Right;  . PUBOVAGINAL SLING N/A 08/21/2017   Procedure: Leonides GrillsPUBO-VAGINAL SLING;  Surgeon: Alfredo MartinezMacDiarmid, Scott, MD;  Location: [facility];  Service: Urology;  Laterality: N/A;  . THYROIDECTOMY  1989    OB History   None      Home Medications    Prior to Admission medications   Medication Sig Start Date End Date Taking? Authorizing Provider  cetirizine (ZYRTEC) 10 MG tablet Take 10 mg by mouth daily as needed for allergies.   Yes [provider]  FLUoxetine (PROZAC) 20 MG capsule Take 20 mg by mouth daily. 09/29/16  Yes [provider]  gabapentin (NEURONTIN) 100 MG  capsule Take 200 mg by mouth at bedtime. 05/01/18  Yes [provider]  levothyroxine (SYNTHROID, LEVOTHROID) 88 MCG tablet Take 88 mcg by mouth daily before breakfast.  12/29/16  Yes [provider]  methocarbamol (ROBAXIN) 500 MG tablet Take 1 tablet (500 mg total) by mouth every 6 (six) hours as needed for muscle spasms. 06/28/18  Yes Ivar DrapeFerri, Amanda, PA-C  oxyCODONE (ROXICODONE) 5 MG immediate release tablet Take 1 tablet (5 mg total) by mouth every 4 (four) hours as needed for breakthrough pain. 06/28/18  Yes Ivar DrapeFerri, Amanda, PA-C  cephALEXin (KEFLEX) 500 MG capsule Take 1 capsule (500 mg total) by mouth 2 (two) times daily. 07/10/18   Lutricia Feiloemer, William P, PA-C  phenazopyridine (PYRIDIUM) 200 MG tablet Take 1 tablet (200 mg total) by mouth 3 (three) times daily. 07/10/18   Lutricia Feiloemer, William P, PA-C    Family History Family History  Problem Relation Age of Onset  . Parkinson's disease Mother   . Dementia Mother   . Heart attack Father   . Breast cancer Neg Hx     Social History Social History   Tobacco Use  . Smoking status: Former Smoker    Types: Cigarettes    Last attempt to quit: 08/08/2012    Years since quitting: 5.9  . Smokeless tobacco: Never Used  Substance Use Topics  . Alcohol use: Yes    Alcohol/week: 3.0 standard drinks    Types: 3  Cans of beer per week  . Drug use: No     Allergies   Patient has no known allergies.   Review of Systems Review of Systems  Constitutional: Positive for activity change. Negative for appetite change, chills, fatigue and fever.  Genitourinary: Positive for frequency, hematuria and urgency. Negative for vaginal discharge.  All other systems reviewed and are negative.    Physical Exam Triage Vital Signs ED Triage Vitals  Enc Vitals Group     BP 07/10/18 0911 120/69     Pulse Rate 07/10/18 0911 89     Resp 07/10/18 0911 16     Temp 07/10/18 0911 98.4 F (36.9 C)     Temp Source 07/10/18 0911 Oral     SpO2 07/10/18  0911 97 %     Weight 07/10/18 0910 185 lb (83.9 kg)     Height 07/10/18 0910 5\' 4"  (1.626 m)     Head Circumference --      Peak Flow --      Pain Score 07/10/18 0910 7     Pain Loc --      Pain Edu? --      Excl. in GC? --    No data found.  Updated Vital Signs BP 120/69 (BP Location: Left Arm)   Pulse 89   Temp 98.4 F (36.9 C) (Oral)   Resp 16   Ht 5\' 4"  (1.626 m)   Wt 185 lb (83.9 kg)   SpO2 97%   BMI 31.76 kg/m   Visual Acuity Right Eye Distance:   Left Eye Distance:   Bilateral Distance:    Right Eye Near:   Left Eye Near:    Bilateral Near:     Physical Exam  Constitutional: She is oriented to person, place, and time. She appears well-developed and well-nourished. No distress.  HENT:  Head: Normocephalic.  Eyes: Pupils are equal, round, and reactive to light. Right eye exhibits no discharge. Left eye exhibits no discharge.  Neck: Normal range of motion.  Pulmonary/Chest: Effort normal and breath sounds normal.  Abdominal: Soft. Bowel sounds are normal. She exhibits no distension. There is no tenderness. There is no rebound and no guarding.  Musculoskeletal: Normal range of motion.  Neurological: She is alert and oriented to person, place, and time.  Skin: Skin is warm and dry. She is not diaphoretic.  Psychiatric: She has a normal mood and affect. Her behavior is normal. Judgment and thought content normal.  Nursing note and vitals reviewed.    UC Treatments / Results  Labs (all labs ordered are listed, but only abnormal results are displayed) Labs Reviewed  URINALYSIS, COMPLETE (UACMP) WITH MICROSCOPIC - Abnormal; Notable for the following components:      Result Value   APPearance HAZY (*)    Hgb urine dipstick MODERATE (*)    Bilirubin Urine SMALL (*)    Ketones, ur TRACE (*)    Protein, ur 100 (*)    Nitrite POSITIVE (*)    Leukocytes, UA MODERATE (*)    Bacteria, UA MANY (*)    All other components within normal limits  URINE CULTURE     EKG None  Radiology No results found.  Procedures Procedures (including critical care time)  Medications Ordered in UC Medications - No data to display  Initial Impression / Assessment and Plan / UC Course  I have reviewed the triage vital signs and the nursing notes.  Pertinent labs & imaging results that were available during my care  of the patient were reviewed by me and considered in my medical decision making (see chart for details).     Findings of the urine analysis just of of UTI.  Reasonable culture the urine.  Patient on Keflex for 5 days.  Will also have another prescription for Pyridium.  Results of the cultures and sensitivities will direct further treatment plan Final Clinical Impressions(s) / UC Diagnoses   Final diagnoses:  Acute cystitis with hematuria     Discharge Instructions     Plenty of fluids.    ED Prescriptions    Medication Sig Dispense Auth. Provider   phenazopyridine (PYRIDIUM) 200 MG tablet Take 1 tablet (200 mg total) by mouth 3 (three) times daily. 6 tablet Ovid Curd P, PA-C   cephALEXin (KEFLEX) 500 MG capsule Take 1 capsule (500 mg total) by mouth 2 (two) times daily. 10 capsule Lutricia Feil, PA-C     Controlled Substance Prescriptions [place] Controlled Substance Registry consulted? Not Applicable   Lutricia Feil, PA-C 07/10/18 2111

## 2018-07-12 ENCOUNTER — Telehealth (HOSPITAL_COMMUNITY): Payer: Self-pay | Admitting: Emergency Medicine

## 2018-07-12 LAB — URINE CULTURE: Culture: >=100000 — AB

## 2018-07-12 NOTE — Telephone Encounter (Signed)
Urine culture was positive for e coli and was given keflex  at urgent care visit. Attempted to reach patient. No answer at this time.   

## 2018-09-08 ENCOUNTER — Ambulatory Visit
Admission: EM | Admit: 2018-09-08 | Discharge: 2018-09-08 | Disposition: A | Discharge status: Home / Self Care | Payer: Managed Care, Other (non HMO) | Attending: Family Medicine | Admitting: Family Medicine

## 2018-09-08 DIAGNOSIS — N3000 Acute cystitis without hematuria: Secondary | ICD-10-CM | POA: Diagnosis not present

## 2018-09-08 DIAGNOSIS — B9689 Other specified bacterial agents as the cause of diseases classified elsewhere: Secondary | ICD-10-CM | POA: Diagnosis not present

## 2018-09-08 LAB — URINALYSIS, COMPLETE (UACMP) WITH MICROSCOPIC
Bilirubin Urine: NEGATIVE
GLUCOSE, UA: NEGATIVE mg/dL
KETONES UR: NEGATIVE mg/dL
Nitrite: NEGATIVE
PH: 6 (ref 5.0–8.0)
Protein, ur: NEGATIVE mg/dL
Specific Gravity, Urine: 1.015 (ref 1.005–1.030)
Squamous Epithelial / LPF: NONE SEEN (ref 0–5)
WBC, UA: >50 WBC/hpf (ref 0–5)

## 2018-09-08 MED ORDER — CEFDINIR 300 MG PO CAPS: 300.0000 mg | ORAL_CAPSULE | Freq: Two times a day (BID) | ORAL | 0 refills | Status: AC

## 2018-09-08 NOTE — ED Provider Notes (Signed)
[facility]    CSN: [id number] Arrival date & time: 09/08/18  [id number]  History   Chief Complaint Chief Complaint  Patient presents with  . Flank Pain   HPI  58 year old female with a recent UTI presents with concerns for UTI.  Symptoms started yesterday.  She reports left lower back pain.  She also reports urinary frequency, urgency.  She states that she has had a few episodes of dysuria but she is not really having much of dysuria.  No fever.  No chills.  No known exacerbating relieving factors.  No known inciting factor.  Symptoms are mild in severity.  No other associated symptoms.  No other complaints.  PMH, Surgical Hx, Family Hx, Social History reviewed and updated as below.  Past Medical History:  Diagnosis Date  . Depression   . Hypothyroidism   . Stress incontinence   . Thyroid disease    Patient Active Problem List   Diagnosis Date Noted  . Neurogenic claudication 06/27/2018   Past Surgical History:  Procedure Laterality Date  . COLONOSCOPY    . CYSTOSCOPY N/A 08/21/2017   Procedure: CYSTOSCOPY;  Surgeon: Alfredo MartinezMacDiarmid, Scott, MD;  Location: [facility];  Service: Urology;  Laterality: N/A;  . LUMBAR LAMINECTOMY/DECOMPRESSION MICRODISCECTOMY Right 06/27/2018   Procedure: LUMBAR LAMINECTOMY/DECOMPRESSION MICRODISCECTOMY 1 LEVEL-L4-5;  Surgeon: Venetia NightYarbrough, Chester, MD;  Location: [facility];  Service: Neurosurgery;  Laterality: Right;  . PUBOVAGINAL SLING N/A 08/21/2017   Procedure: Leonides GrillsPUBO-VAGINAL SLING;  Surgeon: Alfredo MartinezMacDiarmid, Scott, MD;  Location: [facility];  Service: Urology;  Laterality: N/A;  . THYROIDECTOMY  1989   OB History   No obstetric history on file.    Home Medications    Prior to Admission medications   Medication Sig Start Date End Date Taking? Authorizing Provider  cetirizine (ZYRTEC) 10 MG tablet Take 10 mg by mouth daily as needed for allergies.   Yes [provider]  FLUoxetine (PROZAC) 20 MG capsule Take 20 mg by mouth daily. 09/29/16   Yes [provider]  levothyroxine (SYNTHROID, LEVOTHROID) 88 MCG tablet Take 88 mcg by mouth daily before breakfast.  12/29/16  Yes [provider]  cefdinir (OMNICEF) 300 MG capsule Take 1 capsule (300 mg total) by mouth 2 (two) times daily for 7 days. 09/08/18 09/15/18  Tommie Samsook, Gina Leblond G, DO  gabapentin (NEURONTIN) 100 MG capsule Take 200 mg by mouth at bedtime. 05/01/18   [provider]  methocarbamol (ROBAXIN) 500 MG tablet Take 1 tablet (500 mg total) by mouth every 6 (six) hours as needed for muscle spasms. 06/28/18   Ivar DrapeFerri, Amanda, PA-C  oxyCODONE (ROXICODONE) 5 MG immediate release tablet Take 1 tablet (5 mg total) by mouth every 4 (four) hours as needed for breakthrough pain. 06/28/18   Ivar DrapeFerri, Amanda, PA-C  phenazopyridine (PYRIDIUM) 200 MG tablet Take 1 tablet (200 mg total) by mouth 3 (three) times daily. 07/10/18   Lutricia Feiloemer, William P, PA-C    Family History Family History  Problem Relation Age of Onset  . Parkinson's disease Mother   . Dementia Mother   . Heart attack Father   . Breast cancer Neg Hx     Social History Social History   Tobacco Use  . Smoking status: Former Smoker    Types: Cigarettes    Last attempt to quit: 08/08/2012    Years since quitting: 6.0  . Smokeless tobacco: Never Used  Substance Use Topics  . Alcohol use: Yes    Alcohol/week: 3.0 standard drinks    Types: 3  Cans of beer per week  . Drug use: No     Allergies   Patient has no known allergies.   Review of Systems Review of Systems  Constitutional: Negative.   Genitourinary: Positive for frequency and urgency.  Musculoskeletal: Positive for back pain.   Physical Exam Triage Vital Signs ED Triage Vitals  Enc Vitals Group     BP 09/08/18 0822 125/81     Pulse Rate 09/08/18 0822 84     Resp 09/08/18 0822 18     Temp 09/08/18 0822 (!) 97.5 F (36.4 C)     Temp Source 09/08/18 0822 Oral     SpO2 09/08/18 0822 100 %     Weight 09/08/18 0823 190 lb (86.2 kg)      Height 09/08/18 0823 5\' 4"  (1.626 m)     Head Circumference --      Peak Flow --      Pain Score 09/08/18 0823 5     Pain Loc --      Pain Edu? --      Excl. in GC? --    Updated Vital Signs BP 125/81 (BP Location: Left Arm)   Pulse 84   Temp (!) 97.5 F (36.4 C) (Oral)   Resp 18   Ht 5\' 4"  (1.626 m)   Wt 86.2 kg   SpO2 100%   BMI 32.61 kg/m   Visual Acuity Right Eye Distance:   Left Eye Distance:   Bilateral Distance:    Right Eye Near:   Left Eye Near:    Bilateral Near:     Physical Exam Vitals signs and nursing note reviewed.  Constitutional:      General: She is not in acute distress.    Appearance: Normal appearance.  HENT:     Head: Normocephalic and atraumatic.  Eyes:     General: No scleral icterus.    Conjunctiva/sclera: Conjunctivae normal.  Cardiovascular:     Rate and Rhythm: Normal rate and regular rhythm.  Pulmonary:     Effort: Pulmonary effort is normal.     Breath sounds: Normal breath sounds.  Abdominal:     General: There is no distension.     Palpations: Abdomen is soft.     Tenderness: There is no abdominal tenderness.  Neurological:     Mental Status: She is alert.  Psychiatric:        Mood and Affect: Mood normal.        Behavior: Behavior normal.    UC Treatments / Results  Labs (all labs ordered are listed, but only abnormal results are displayed) Labs Reviewed  URINALYSIS, COMPLETE (UACMP) WITH MICROSCOPIC - Abnormal; Notable for the following components:      Result Value   APPearance CLOUDY (*)    Hgb urine dipstick MODERATE (*)    Leukocytes, UA LARGE (*)    Bacteria, UA RARE (*)    All other components within normal limits  URINE CULTURE    EKG None  Radiology No results found.  Procedures Procedures (including critical care time)  Medications Ordered in UC Medications - No data to display  Initial Impression / Assessment and Plan / UC Course  I have reviewed the triage vital signs and the nursing  notes.  Pertinent labs & imaging results that were available during my care of the patient were reviewed by me and considered in my medical decision making (see chart for details).    58 year old female presents with UTI.  Treating with Omnicef.  Sending culture.  Final Clinical Impressions(s) / UC Diagnoses   Final diagnoses:  Acute cystitis without hematuria   Discharge Instructions   None    ED Prescriptions    Medication Sig Dispense Auth. Provider   cefdinir (OMNICEF) 300 MG capsule Take 1 capsule (300 mg total) by mouth 2 (two) times daily for 7 days. 14 capsule Tommie Samsook, Yanice Maqueda G, DO     Controlled Substance Prescriptions [place] Controlled Substance Registry consulted? Not Applicable   Tommie SamsCook, Margarett Viti G, [place]DO 09/08/18 [id number]

## 2018-09-08 NOTE — ED Triage Notes (Signed)
Pt states since yesterday having flank pain on left side, pelvic pressure, frequency and unable to empty her bladder completely. No dysuria reported. No otc meds tried.

## 2018-09-10 ENCOUNTER — Telehealth (HOSPITAL_COMMUNITY): Payer: Self-pay | Admitting: Emergency Medicine

## 2018-09-10 LAB — URINE CULTURE: CULTURE: NO GROWTH

## 2018-09-10 NOTE — Telephone Encounter (Signed)
Urine culture did not suggest a UTI.  Recheck or followup with PCP for further evaluation if symptoms are not improving.  Pt contacted and made aware, verbalized understanding.  

## 2019-05-16 ENCOUNTER — Other Ambulatory Visit: Payer: Self-pay | Admitting: Family Medicine

## 2019-05-16 DIAGNOSIS — Z1231 Encounter for screening mammogram for malignant neoplasm of breast: Secondary | ICD-10-CM

## 2019-05-27 ENCOUNTER — Ambulatory Visit
Admission: RE | Admit: 2019-05-27 | Discharge: 2019-05-27 | Disposition: A | Discharge status: Home / Self Care | Payer: Managed Care, Other (non HMO) | Source: Ambulatory Visit | Attending: Family Medicine | Admitting: Family Medicine

## 2019-05-27 ENCOUNTER — Other Ambulatory Visit: Payer: Self-pay

## 2019-05-27 DIAGNOSIS — Z1231 Encounter for screening mammogram for malignant neoplasm of breast: Secondary | ICD-10-CM

## 2019-10-14 ENCOUNTER — Ambulatory Visit: Payer: Managed Care, Other (non HMO) | Attending: Internal Medicine

## 2019-10-14 DIAGNOSIS — Z20822 Contact with and (suspected) exposure to covid-19: Secondary | ICD-10-CM

## 2019-10-15 LAB — NOVEL CORONAVIRUS, NAA: SARS-CoV-2, NAA: NOT DETECTED

## 2020-01-25 ENCOUNTER — Other Ambulatory Visit: Payer: Self-pay

## 2020-01-25 ENCOUNTER — Ambulatory Visit
Admission: EM | Admit: 2020-01-25 | Discharge: 2020-01-25 | Disposition: A | Discharge status: Home / Self Care | Payer: Managed Care, Other (non HMO) | Attending: Internal Medicine | Admitting: Internal Medicine

## 2020-01-25 DIAGNOSIS — L255 Unspecified contact dermatitis due to plants, except food: Secondary | ICD-10-CM

## 2020-01-25 MED ORDER — HYDROXYZINE HCL 25 MG PO TABS: 25.0000 mg | ORAL_TABLET | Freq: Four times a day (QID) | ORAL | 0 refills | Status: DC

## 2020-01-25 MED ORDER — PREDNISONE 20 MG PO TABS: 20.0000 mg | ORAL_TABLET | Freq: Every day | ORAL | 0 refills | Status: DC

## 2020-01-25 NOTE — ED Provider Notes (Signed)
[facility]    CSN: [id number] Arrival date & time: 01/25/20  1239      History   Chief Complaint Chief Complaint  Patient presents with  . Rash    HPI Abigail West is a 59 y.o. female comes to the urgent care with complaints of pruritic rash over the past few days.  Patient says that the rash started on her nose and then spread to her neck and arms and legs.  She has been applying calamine lotion with partial improvement.  Patient was out pulling weeds days before symptoms started.  She admits having poison ivy in her yard.  No erythema.   HPI  Past Medical History:  Diagnosis Date  . Depression   . Hypothyroidism   . Stress incontinence   . Thyroid disease     Patient Active Problem List   Diagnosis Date Noted  . 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 COLONOSCOPY    . CYSTOSCOPY N/A 08/21/2017   Procedure: CYSTOSCOPY;  Surgeon: Bjorn Loser, MD;  Location: [facility];  Service: Urology;  Laterality: N/A;  . LUMBAR LAMINECTOMY/DECOMPRESSION MICRODISCECTOMY Right 06/27/2018   Procedure: LUMBAR LAMINECTOMY/DECOMPRESSION MICRODISCECTOMY 1 LEVEL-L4-5;  Surgeon: Meade Maw, MD;  Location: [facility];  Service: Neurosurgery;  Laterality: Right;  . PUBOVAGINAL SLING N/A 08/21/2017   Procedure: Gaynelle Arabian;  Surgeon: Bjorn Loser, MD;  Location: [facility];  Service: Urology;  Laterality: N/A;  . THYROIDECTOMY  1989    OB History   No obstetric history on file.      Home Medications    Prior to Admission medications   Medication Sig Start Date End Date Taking? Authorizing Provider  cetirizine (ZYRTEC) 10 MG tablet Take 10 mg by mouth daily as needed for allergies.   Yes [provider]  FLUoxetine (PROZAC) 20 MG capsule Take 20 mg by mouth daily. 09/29/16  Yes [provider]  levothyroxine (SYNTHROID, LEVOTHROID) 88 MCG tablet Take 88 mcg by mouth daily before  breakfast.  12/29/16  Yes [provider]  hydrOXYzine (ATARAX/VISTARIL) 25 MG tablet Take 1 tablet (25 mg total) by mouth every 6 (six) hours. 01/25/20   Beckham Capistran, Myrene Galas, MD  predniSONE (DELTASONE) 20 MG tablet Take 1 tablet (20 mg total) by mouth daily for 5 days. 01/25/20 01/30/20  Chase Picket, MD  gabapentin (NEURONTIN) 100 MG capsule Take 200 mg by mouth at bedtime. 05/01/18 01/25/20  [provider]    Family History Family History  Problem Relation Age of Onset  . Parkinson's disease Mother   . Dementia Mother   . Heart attack Father   . Breast cancer Neg Hx     Social History Social History   Tobacco Use  . Smoking status: Former Smoker    Types: Cigarettes    Quit date: 08/08/2012    Years since quitting: 7.4  . Smokeless tobacco: Never Used  Vaping Use  . Vaping Use: Former  . Start date: 01/19/2016  Substance Use Topics  . Alcohol use: Yes    Alcohol/week: 3.0 standard drinks    Types: 3 Cans of beer per week  . Drug use: No     Allergies   Patient has no known allergies.   Review of Systems Review of Systems  HENT: Negative.   Musculoskeletal: Negative for arthralgias, joint swelling and myalgias.  Skin: Positive for rash. Negative for color change and wound.  Neurological: Negative.  Physical Exam Triage Vital Signs ED Triage Vitals  Enc Vitals Group     BP 01/25/20 1315 128/67     Pulse Rate 01/25/20 1315 62     Resp 01/25/20 1315 18     Temp 01/25/20 1315 98.4 F (36.9 C)     Temp Source 01/25/20 1315 Oral     SpO2 01/25/20 1315 100 %     Weight --      Height --      Head Circumference --      Peak Flow --      Pain Score 01/25/20 1312 0     Pain Loc --      Pain Edu? --      Excl. in GC? --    No data found.  Updated Vital Signs BP 128/67 (BP Location: Left Arm)   Pulse 62   Temp 98.4 F (36.9 C) (Oral)   Resp 18   SpO2 100%   Visual Acuity Right Eye Distance:   Left Eye Distance:   Bilateral  Distance:    Right Eye Near:   Left Eye Near:    Bilateral Near:     Physical Exam Vitals and nursing note reviewed.  Cardiovascular:     Rate and Rhythm: Normal rate and regular rhythm.  Abdominal:     General: Bowel sounds are normal.     Palpations: Abdomen is soft.  Skin:    General: Skin is warm.     Capillary Refill: Capillary refill takes less than 2 seconds.     Findings: Erythema and rash present. No bruising or lesion.  Neurological:     Mental Status: She is alert.      UC Treatments / Results  Labs (all labs ordered are listed, but only abnormal results are displayed) Labs Reviewed - No data to display  EKG   Radiology No results found.  Procedures Procedures (including critical care time)  Medications Ordered in UC Medications - No data to display  Initial Impression / Assessment and Plan / UC Course  I have reviewed the triage vital signs and the nursing notes.  Pertinent labs & imaging results that were available during my care of the patient were reviewed by me and considered in my medical decision making (see chart for details).     1.  Rhus dermatitis: Prednisone 20 mg orally daily for 5 days Hydroxyzine 25 mg every 6 hours as needed for itching Continue applying calamine lotion to the areas involved If you notice any erythema that is worsening, return to urgent care to be reevaluated. Return precautions given. Final Clinical Impressions(s) / UC Diagnoses   Final diagnoses:  Rhus dermatitis   Discharge Instructions   None    ED Prescriptions    Medication Sig Dispense Auth. Provider   predniSONE (DELTASONE) 20 MG tablet Take 1 tablet (20 mg total) by mouth daily for 5 days. 5 tablet Trennon Torbeck, Britta Mccreedy, MD   hydrOXYzine (ATARAX/VISTARIL) 25 MG tablet Take 1 tablet (25 mg total) by mouth every 6 (six) hours. 20 tablet Jaunita Mikels, Britta Mccreedy, MD     PDMP not reviewed this encounter.   Merrilee Jansky, MD 01/25/20 1725

## 2020-01-25 NOTE — ED Triage Notes (Signed)
Pt presents with rash.  Started on nose a few days ago.  Now spread to neck, arms and starting on legs.  Had been out pulling weeds in the days before rash appeared.  Has applied a calamine lotion.

## 2020-01-30 ENCOUNTER — Other Ambulatory Visit: Payer: Self-pay

## 2020-01-30 ENCOUNTER — Encounter: Payer: Self-pay | Admitting: Emergency Medicine

## 2020-01-30 ENCOUNTER — Ambulatory Visit
Admission: EM | Admit: 2020-01-30 | Discharge: 2020-01-30 | Disposition: A | Discharge status: Home / Self Care | Payer: Managed Care, Other (non HMO) | Attending: Family Medicine | Admitting: Family Medicine

## 2020-01-30 DIAGNOSIS — L255 Unspecified contact dermatitis due to plants, except food: Secondary | ICD-10-CM | POA: Diagnosis not present

## 2020-01-30 MED ORDER — PREDNISONE 10 MG PO TABS: ORAL_TABLET | ORAL | 0 refills | Status: DC

## 2020-01-30 NOTE — ED Triage Notes (Signed)
Patient states she was here on Saturday for poison ivy and given Prednisone and Hydralazine. She states the rash on her face has improved but she now has areas on both of her arms.

## 2020-01-30 NOTE — ED Provider Notes (Signed)
[facility]    CSN: [id number] Arrival date & time: 01/30/20  1336      History   Chief Complaint Chief Complaint  Patient presents with  . Poison Ivy    HPI   59 year old West presents with the above complaint.  Patient recently seen here for poison ivy.  Was given prednisone and Atarax.  Patient reports improvement but then recent worsening of her rash.  She now has several other areas affected.  Denies pain.  She does report itching.  She is uncomfortable.  No relieving factors.  No other associated symptoms.  No other complaints.  Past Medical History:  Diagnosis Date  . Depression   . Hypothyroidism   . Stress incontinence   . Thyroid disease     Patient Active Problem List   Diagnosis Date Noted  . 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 COLONOSCOPY    . CYSTOSCOPY N/A 08/21/2017   Procedure: CYSTOSCOPY;  Surgeon: Bjorn Loser, MD;  Location: [facility];  Service: Urology;  Laterality: N/A;  . LUMBAR LAMINECTOMY/DECOMPRESSION MICRODISCECTOMY Right 06/27/2018   Procedure: LUMBAR LAMINECTOMY/DECOMPRESSION MICRODISCECTOMY 1 LEVEL-L4-5;  Surgeon: Meade Maw, MD;  Location: [facility];  Service: Neurosurgery;  Laterality: Right;  . PUBOVAGINAL SLING N/A 08/21/2017   Procedure: Gaynelle Arabian;  Surgeon: Bjorn Loser, MD;  Location: [facility];  Service: Urology;  Laterality: N/A;  . THYROIDECTOMY  1989    OB History   No obstetric history on file.      Home Medications    Prior to Admission medications   Medication Sig Start Date End Date Taking? Authorizing Provider  cetirizine (ZYRTEC) 10 MG tablet Take 10 mg by mouth daily as needed for allergies.   Yes [provider]  FLUoxetine (PROZAC) 20 MG capsule Take 20 mg by mouth daily. 09/29/16  Yes [provider]  hydrOXYzine (ATARAX/VISTARIL) 25 MG tablet Take 1 tablet (25 mg total) by mouth every 6 (six) hours.  01/25/20  Yes Lamptey, Myrene Galas, MD  levothyroxine (SYNTHROID, LEVOTHROID) 88 MCG tablet Take 88 mcg by mouth daily before breakfast.  12/29/16  Yes [provider]  predniSONE (DELTASONE) 10 MG tablet 50 mg daily x 3 days, then 40 mg daily x 3 days, then 30 mg daily x 3 days, then 20 mg daily x 3 days, then 10 mg daily x 3 days. 01/30/20   Coral Spikes, DO  gabapentin (NEURONTIN) 100 MG capsule Take 200 mg by mouth at bedtime. 05/01/18 01/25/20  [provider]    Family History Family History  Problem Relation Age of Onset  . Parkinson's disease Mother   . Dementia Mother   . Heart attack Father   . Breast cancer Neg Hx     Social History Social History   Tobacco Use  . Smoking status: Former Smoker    Types: Cigarettes    Quit date: 08/08/2012    Years since quitting: 7.4  . Smokeless tobacco: Never Used  Vaping Use  . Vaping Use: Former  . Start date: 01/19/2016  Substance Use Topics  . Alcohol use: Yes    Alcohol/week: 3.0 standard drinks    Types: 3 Cans of beer per week  . Drug use: No     Allergies   Patient has no known allergies.   Review of Systems Review of Systems  Skin: Positive for rash.   Physical Exam Triage Vital Signs ED Triage Vitals  Enc Vitals Group  BP 01/30/20 1344 (!) 145/70     Pulse Rate 01/30/20 1344 Abigail     Resp 01/30/20 1344 16     Temp 01/30/20 1344 97.7 F (36.5 C)     Temp Source 01/30/20 1344 Oral     SpO2 01/30/20 1344 100 %     Weight 01/30/20 1342 190 lb 0.6 oz (86.2 kg)     Height 01/30/20 1342 5\' 4"  (1.626 m)     Head Circumference --      Peak Flow --      Pain Score 01/30/20 1342 0     Pain Loc --      Pain Edu? --      Excl. in GC? --    Updated Vital Signs BP (!) 145/70 (BP Location: Right Arm)   Pulse Abigail   Temp 97.7 F (36.5 C) (Oral)   Resp 16   Ht 5\' 4"  (1.626 m)   Wt 86.2 kg   SpO2 100%   BMI 32.62 kg/m   Visual Acuity Right Eye Distance:   Left Eye Distance:   Bilateral  Distance:    Right Eye Near:   Left Eye Near:    Bilateral Near:     Physical Exam Vitals and nursing note reviewed.  Constitutional:      General: She is not in acute distress.    Appearance: Normal appearance. She is not ill-appearing.  HENT:     Head: Normocephalic and atraumatic.  Eyes:     General:        Right eye: No discharge.        Left eye: No discharge.     Conjunctiva/sclera: Conjunctivae normal.  Pulmonary:     Effort: Pulmonary effort is normal. No respiratory distress.  Skin:    Comments: Wrist, arms with vesicular/bullous rash.  Neurological:     Mental Status: She is alert.  Psychiatric:        Mood and Affect: Mood normal.        Behavior: Behavior normal.    UC Treatments / Results  Labs (all labs ordered are listed, but only abnormal results are displayed) Labs Reviewed - No data to display  EKG   Radiology No results found.  Procedures Procedures (including critical care time)  Medications Ordered in UC Medications - No data to display  Initial Impression / Assessment and Plan / UC Course  I have reviewed the triage vital signs and the nursing notes.  Pertinent labs & imaging results that were available during my care of the patient were reviewed by me and considered in my medical decision making (see chart for details).    59 year old West presents with dermatitis secondary to poison ivy.  Treating with prednisone.   Final Clinical Impressions(s) / UC Diagnoses   Final diagnoses:  Dermatitis due to plants, including poison ivy, sumac, and oak   Discharge Instructions   None    ED Prescriptions    Medication Sig Dispense Auth. Provider   predniSONE (DELTASONE) 10 MG tablet 50 mg daily x 3 days, then 40 mg daily x 3 days, then 30 mg daily x 3 days, then 20 mg daily x 3 days, then 10 mg daily x 3 days. 45 tablet , DO     PDMP not reviewed this encounter.   60, Tommie Sams 01/30/20 1542

## 2020-03-25 ENCOUNTER — Telehealth: Payer: Self-pay

## 2020-03-25 NOTE — Telephone Encounter (Signed)
Received referral from Dr. Burnadette Pop for lung CT screening program.  Message left for patient to contact Glenna Fellows, lung navigator to schedule scan.

## 2020-04-22 ENCOUNTER — Telehealth: Payer: Self-pay

## 2020-04-22 NOTE — Telephone Encounter (Signed)
Contacted patient for lung CT screening program after receiving referral from Dr. Burnadette Pop.  I explained program to patient and she recalls explanation of program by Dr. Burnadette Pop.  She states she will think about the program and talk it over with her husband and call us back.  She understands the significance of having cancer screenings, but is just not sure she wants to participate. I encouraged her to think about it and talk with her husband and call us if she had any questions or wanted to enroll.

## 2020-06-18 ENCOUNTER — Other Ambulatory Visit: Payer: Self-pay

## 2020-06-18 ENCOUNTER — Ambulatory Visit
Admission: EM | Admit: 2020-06-18 | Discharge: 2020-06-18 | Disposition: A | Discharge status: Home / Self Care | Payer: Managed Care, Other (non HMO) | Attending: Emergency Medicine | Admitting: Emergency Medicine

## 2020-06-18 ENCOUNTER — Ambulatory Visit (INDEPENDENT_AMBULATORY_CARE_PROVIDER_SITE_OTHER): Payer: Managed Care, Other (non HMO)

## 2020-06-18 DIAGNOSIS — M79672 Pain in left foot: Secondary | ICD-10-CM

## 2020-06-18 DIAGNOSIS — M109 Gout, unspecified: Secondary | ICD-10-CM | POA: Insufficient documentation

## 2020-06-18 LAB — CBC WITH DIFFERENTIAL/PLATELET
Abs Immature Granulocytes: 0.02 10*3/uL (ref 0.00–0.07)
Basophils Absolute: 0 10*3/uL (ref 0.0–0.1)
Basophils Relative: 1 %
Eosinophils Absolute: 0.1 10*3/uL (ref 0.0–0.5)
Eosinophils Relative: 2 %
HCT: 44.5 % (ref 36.0–46.0)
Hemoglobin: 14.6 g/dL (ref 12.0–15.0)
Immature Granulocytes: 0 %
Lymphocytes Relative: 29 %
Lymphs Abs: 1.5 10*3/uL (ref 0.7–4.0)
MCH: 30 pg (ref 26.0–34.0)
MCHC: 32.8 g/dL (ref 30.0–36.0)
MCV: 91.4 fL (ref 80.0–100.0)
Monocytes Absolute: 0.6 10*3/uL (ref 0.1–1.0)
Monocytes Relative: 10 %
Neutro Abs: 3.1 10*3/uL (ref 1.7–7.7)
Neutrophils Relative %: 58 %
Platelets: 319 10*3/uL (ref 150–400)
RBC: 4.87 MIL/uL (ref 3.87–5.11)
RDW: 12.6 % (ref 11.5–15.5)
WBC: 5.4 10*3/uL (ref 4.0–10.5)
nRBC: 0 % (ref 0.0–0.2)

## 2020-06-18 LAB — COMPREHENSIVE METABOLIC PANEL
ALT: 19 U/L (ref 0–44)
AST: 18 U/L (ref 15–41)
Albumin: 4.4 g/dL (ref 3.5–5.0)
Alkaline Phosphatase: 64 U/L (ref 38–126)
Anion gap: 8 (ref 5–15)
BUN: 12 mg/dL (ref 6–20)
CO2: 28 mmol/L (ref 22–32)
Calcium: 8.9 mg/dL (ref 8.9–10.3)
Chloride: 103 mmol/L (ref 98–111)
Creatinine, Ser: 0.83 mg/dL (ref 0.44–1.00)
GFR, Estimated: >60 mL/min (ref >60)
Glucose, Bld: 84 mg/dL (ref 70–99)
Potassium: 4.2 mmol/L (ref 3.5–5.1)
Sodium: 139 mmol/L (ref 135–145)
Total Bilirubin: 0.6 mg/dL (ref 0.3–1.2)
Total Protein: 7.8 g/dL (ref 6.5–8.1)

## 2020-06-18 LAB — URIC ACID: Uric Acid, Serum: 4.9 mg/dL (ref 2.5–7.1)

## 2020-06-18 MED ORDER — KETOROLAC TROMETHAMINE 60 MG/2ML IM SOLN
60.0000 mg | Freq: Once | INTRAMUSCULAR | Status: AC
  Administered 2020-06-18: 60 mg via INTRAMUSCULAR

## 2020-06-18 MED ORDER — PREDNISONE 10 MG (21) PO TBPK: ORAL_TABLET | Freq: Every day | ORAL | 0 refills | Status: DC

## 2020-06-18 NOTE — ED Provider Notes (Signed)
[facility]    CSN: [id number] Arrival date & time: 06/18/20  1245      History   Chief Complaint Chief Complaint  Patient presents with  . Foot Pain    left    HPI Abigail West is a 59 y.o. female.   59 year old female here for evaluation of left foot pain.  Patient reports that her pain started last night.  She is complaining of throbbing pain in her left arch, left ankle, and heel.  Patient is not aware of any injury to the area.  The only thing that she is done different is she wore different pair shoes that she has worn in months.  She denies numbness or tingling.     Past Medical History:  Diagnosis Date  . Depression   . Hypothyroidism   . Stress incontinence   . Thyroid disease     Patient Active Problem List   Diagnosis Date Noted  . Neurogenic claudication (HCC) 06/27/2018    Past Surgical History:  Procedure Laterality Date  . COLONOSCOPY    . CYSTOSCOPY N/A 08/21/2017   Procedure: CYSTOSCOPY;  Surgeon: Alfredo Martinez, MD;  Location: [facility];  Service: Urology;  Laterality: N/A;  . LUMBAR LAMINECTOMY/DECOMPRESSION MICRODISCECTOMY Right 06/27/2018   Procedure: LUMBAR LAMINECTOMY/DECOMPRESSION MICRODISCECTOMY 1 LEVEL-L4-5;  Surgeon: Venetia Night, MD;  Location: [facility];  Service: Neurosurgery;  Laterality: Right;  . PUBOVAGINAL SLING N/A 08/21/2017   Procedure: Leonides Grills;  Surgeon: Alfredo Martinez, MD;  Location: [facility];  Service: Urology;  Laterality: N/A;  . THYROIDECTOMY  1989    OB History   No obstetric history on file.      Home Medications    Prior to Admission medications   Medication Sig Start Date End Date Taking? Authorizing Provider  FLUoxetine (PROZAC) 20 MG capsule Take 20 mg by mouth daily. 09/29/16  Yes [provider]  levothyroxine (SYNTHROID, LEVOTHROID) 88 MCG tablet Take 88 mcg by mouth daily before breakfast.  12/29/16  Yes [provider]  predniSONE (STERAPRED  UNI-PAK 21 TAB) 10 MG (21) TBPK tablet Take by mouth daily. Take 6 tabs by mouth daily  for 2 days, then 5 tabs for 2 days, then 4 tabs for 2 days, then 3 tabs for 2 days, 2 tabs for 2 days, then 1 tab by mouth daily for 2 days 06/18/20   Becky Augusta, NP  cetirizine (ZYRTEC) 10 MG tablet Take 10 mg by mouth daily as needed for allergies.  06/18/20  [provider]  gabapentin (NEURONTIN) 100 MG capsule Take 200 mg by mouth at bedtime. 05/01/18 01/25/20  [provider]    Family History Family History  Problem Relation Age of Onset  . Parkinson's disease Mother   . Dementia Mother   . Heart attack Father   . Breast cancer Neg Hx     Social History Social History   Tobacco Use  . Smoking status: Former Smoker    Types: Cigarettes    Quit date: 08/08/2012    Years since quitting: 7.8  . Smokeless tobacco: Never Used  Vaping Use  . Vaping Use: Former  . Start date: 01/19/2016  Substance Use Topics  . Alcohol use: Yes    Alcohol/week: 3.0 standard drinks    Types: 3 Cans of beer per week  . Drug use: No     Allergies   Patient has no known allergies.   Review of Systems Review of Systems  Constitutional: Negative for activity change,  appetite change and fever.  HENT: Negative for congestion and sore throat.   Respiratory: Negative for cough and shortness of breath.   Cardiovascular: Negative for chest pain and leg swelling.  Gastrointestinal: Negative for nausea and vomiting.  Musculoskeletal: Positive for arthralgias and myalgias.       Pain in left ankle and arch of left foot.  Skin: Positive for color change.       Redness to left big toe and medial foot.  Neurological: Negative for weakness and numbness.  Hematological: Negative.   Psychiatric/Behavioral: Negative.      Physical Exam Triage Vital Signs ED Triage Vitals  Enc Vitals Group     BP 06/18/20 1335 122/69     Pulse Rate 06/18/20 1335 67     Resp 06/18/20 1335 17     Temp 06/18/20  1335 98.4 F (36.9 C)     Temp Source 06/18/20 1335 Oral     SpO2 06/18/20 1335 100 %     Weight 06/18/20 1333 190 lb 0.6 oz (86.2 kg)     Height 06/18/20 1333 5\' 4"  (1.626 m)     Head Circumference --      Peak Flow --      Pain Score 06/18/20 1333 9     Pain Loc --      Pain Edu? --      Excl. in GC? --    No data found.  Updated Vital Signs BP 122/69 (BP Location: Left Arm)   Pulse 67   Temp 98.4 F (36.9 C) (Oral)   Resp 17   Ht 5\' 4"  (1.626 m)   Wt 190 lb 0.6 oz (86.2 kg)   SpO2 100%   BMI 32.62 kg/m   Visual Acuity Right Eye Distance:   Left Eye Distance:   Bilateral Distance:    Right Eye Near:   Left Eye Near:    Bilateral Near:     Physical Exam Vitals and nursing note reviewed.  Constitutional:      General: She is in acute distress.     Appearance: Normal appearance. She is not toxic-appearing.     Comments: Patient appears in pain.  HENT:     Head: Normocephalic and atraumatic.  Eyes:     General: No scleral icterus.    Extraocular Movements: Extraocular movements intact.     Conjunctiva/sclera: Conjunctivae normal.     Pupils: Pupils are equal, round, and reactive to light.  Musculoskeletal:        General: Swelling and tenderness present. No deformity or signs of injury.     Comments: There is swelling of the left foot from the talar joint and anterior aspect of the medial malleoli to the toes.  The MTP of the left great toe is erythematous and warm with redness extending up the metatarsal towards the arch.  There is marked tenderness and edema to the arch of the left foot less tenderness to the plantar fascia.  Patient has some tenderness and swelling to the anterior aspect of the talar joint.  Patient also has tenderness to the inferior aspect of the medial malleolus.  Lateral malleoli demonstrate some edema but no tenderness.  No Achilles or calcaneal tenderness.  Patient cannot dorsiflex or plantarflex her foot without pain.  Inversion eversion  also causes pain.  Cap refill is less than 2 seconds.  Patient has full sensation.  There is swelling to her distal foot and toes.  Skin:    General: Skin is warm  and dry.     Capillary Refill: Capillary refill takes less than 2 seconds.     Findings: Erythema present. No bruising.  Neurological:     General: No focal deficit present.     Mental Status: She is alert and oriented to person, place, and time.  Psychiatric:        Mood and Affect: Mood normal.        Behavior: Behavior normal.        Thought Content: Thought content normal.        Judgment: Judgment normal.      UC Treatments / Results  Labs (all labs ordered are listed, but only abnormal results are displayed) Labs Reviewed  COMPREHENSIVE METABOLIC PANEL  CBC WITH DIFFERENTIAL/PLATELET  URIC ACID    EKG   Radiology DG Foot Complete Left  Result Date: 06/18/2020 CLINICAL DATA:  Onset left foot pain yesterday.  No known injury. EXAM: LEFT FOOT - COMPLETE 3+ VIEW COMPARISON:  None. FINDINGS: There is no evidence of fracture or dislocation. There is no evidence of arthropathy or other focal bone abnormality. Soft tissues are unremarkable. IMPRESSION: Normal exam. Electronically Signed   By: Drusilla Kanner M.D.   On: 06/18/2020 14:31    Procedures Procedures (including critical care time)  Medications Ordered in UC Medications  ketorolac (TORADOL) injection 60 mg (60 mg Intramuscular Given 06/18/20 1438)    Initial Impression / Assessment and Plan / UC Course  I have reviewed the triage vital signs and the nursing notes.  Pertinent labs & imaging results that were available during my care of the patient were reviewed by me and considered in my medical decision making (see chart for details).   Patient is here for evaluation of pain and swelling of her left foot.  The pain is primarily in the arch of her foot and there is also swelling there.  As noted on physical exam there is redness and swelling of the  MTP joint of the left great toe.  Patient has no history of gout and is not taking any medications on a regular basis including aspirin.  Patient denies injury.  The only thing different is that the day before her symptoms started she wore a pair of white shield shoes that she had not worn in quite a while but does not remember twisting her ankle or having any pain while wearing them.  With the redness and swelling and tenderness to the MTP of the left great toe suspicious for gout.  Will check CBC, CMP and uric acid.  Also concern for possible stress fracture and will obtain x-ray of left foot.  Radiology read of left foot x-ray is no fracture or dislocation.  CBC shows no signs of an infection.  CBC and uric acid are normal.  Patient reports that she has had some improvement in pain with the Toradol.  Her redness has decreased slightly as has her swelling.  Suspect patient has new onset gout.  Will DC home with prednisone pack, instructed patient to keep her foot elevated, instruct patient to increase her water intake.  Final Clinical Impressions(s) / UC Diagnoses   Final diagnoses:  Acute gout of left foot, unspecified cause     Discharge Instructions     Increase your oral fluid intake so that you increase your urine production I recommend a goal of 1 gallon a day.  Take the prednisone pack as directed by the package insert.  Start tomorrow morning.  Take it with  food.  Keep your left foot elevated as much as possible.  If your symptoms do not improve you need to return for reevaluation or follow-up with your primary care doctor.    ED Prescriptions    Medication Sig Dispense Auth. Provider   predniSONE (STERAPRED UNI-PAK 21 TAB) 10 MG (21) TBPK tablet Take by mouth daily. Take 6 tabs by mouth daily  for 2 days, then 5 tabs for 2 days, then 4 tabs for 2 days, then 3 tabs for 2 days, 2 tabs for 2 days, then 1 tab by mouth daily for 2 days 42 tablet Becky Augusta, NP     PDMP not  reviewed this encounter.   Becky Augusta, NP 06/18/20 [id number]

## 2020-06-18 NOTE — ED Triage Notes (Addendum)
Patient complains of left foot pain that started yesterday afternoon that started worsening. Patient states that the pain is in the instep and has been throbbing. Denies any known injury.

## 2020-06-18 NOTE — Discharge Instructions (Addendum)
Increase your oral fluid intake so that you increase your urine production I recommend a goal of 1 gallon a day.  Take the prednisone pack as directed by the package insert.  Start tomorrow morning.  Take it with food.  Keep your left foot elevated as much as possible.  If your symptoms do not improve you need to return for reevaluation or follow-up with your primary care doctor.

## 2020-07-17 ENCOUNTER — Encounter: Payer: Self-pay | Admitting: Emergency Medicine

## 2020-07-17 ENCOUNTER — Emergency Department: Payer: Managed Care, Other (non HMO)

## 2020-07-17 ENCOUNTER — Other Ambulatory Visit: Payer: Self-pay

## 2020-07-17 ENCOUNTER — Emergency Department
Admission: EM | Admit: 2020-07-17 | Discharge: 2020-07-17 | Disposition: A | Discharge status: Home / Self Care | Payer: Managed Care, Other (non HMO) | Attending: Emergency Medicine | Admitting: Emergency Medicine

## 2020-07-17 DIAGNOSIS — R109 Unspecified abdominal pain: Secondary | ICD-10-CM | POA: Diagnosis not present

## 2020-07-17 DIAGNOSIS — Z5321 Procedure and treatment not carried out due to patient leaving prior to being seen by health care provider: Secondary | ICD-10-CM | POA: Diagnosis not present

## 2020-07-17 DIAGNOSIS — R079 Chest pain, unspecified: Secondary | ICD-10-CM | POA: Insufficient documentation

## 2020-07-17 LAB — CBC WITH DIFFERENTIAL/PLATELET
Abs Immature Granulocytes: 0.03 10*3/uL (ref 0.00–0.07)
Basophils Absolute: 0 10*3/uL (ref 0.0–0.1)
Basophils Relative: 0 %
Eosinophils Absolute: 0.1 10*3/uL (ref 0.0–0.5)
Eosinophils Relative: 1 %
HCT: 41.7 % (ref 36.0–46.0)
Hemoglobin: 13.9 g/dL (ref 12.0–15.0)
Immature Granulocytes: 0 %
Lymphocytes Relative: 11 %
Lymphs Abs: 1 10*3/uL (ref 0.7–4.0)
MCH: 29.7 pg (ref 26.0–34.0)
MCHC: 33.3 g/dL (ref 30.0–36.0)
MCV: 89.1 fL (ref 80.0–100.0)
Monocytes Absolute: 0.8 10*3/uL (ref 0.1–1.0)
Monocytes Relative: 8 %
Neutro Abs: 7.5 10*3/uL (ref 1.7–7.7)
Neutrophils Relative %: 80 %
Platelets: 290 10*3/uL (ref 150–400)
RBC: 4.68 MIL/uL (ref 3.87–5.11)
RDW: 12.5 % (ref 11.5–15.5)
WBC: 9.4 10*3/uL (ref 4.0–10.5)
nRBC: 0 % (ref 0.0–0.2)

## 2020-07-17 LAB — COMPREHENSIVE METABOLIC PANEL
ALT: 14 U/L (ref 0–44)
AST: 14 U/L — ABNORMAL LOW (ref 15–41)
Albumin: 4.1 g/dL (ref 3.5–5.0)
Alkaline Phosphatase: 66 U/L (ref 38–126)
Anion gap: 9 (ref 5–15)
BUN: 9 mg/dL (ref 6–20)
CO2: 23 mmol/L (ref 22–32)
Calcium: 8.9 mg/dL (ref 8.9–10.3)
Chloride: 101 mmol/L (ref 98–111)
Creatinine, Ser: 0.65 mg/dL (ref 0.44–1.00)
GFR, Estimated: >60 mL/min (ref >60)
Glucose, Bld: 158 mg/dL — ABNORMAL HIGH (ref 70–99)
Potassium: 3.6 mmol/L (ref 3.5–5.1)
Sodium: 133 mmol/L — ABNORMAL LOW (ref 135–145)
Total Bilirubin: 1.4 mg/dL — ABNORMAL HIGH (ref 0.3–1.2)
Total Protein: 7.4 g/dL (ref 6.5–8.1)

## 2020-07-17 LAB — LIPASE, BLOOD: Lipase: 27 U/L (ref 11–51)

## 2020-07-17 LAB — TROPONIN I (HIGH SENSITIVITY)
Troponin I (High Sensitivity): 4 ng/L (ref <18)
Troponin I (High Sensitivity): 3 ng/L (ref <18)

## 2020-07-17 NOTE — ED Triage Notes (Signed)
Patient ambulatory to triage with steady gait, without difficulty or distress noted; pt reports mid CP radiating into abd x 2 days since eating chilli beans; denies accomp symptoms; denies hx of same

## 2020-07-27 ENCOUNTER — Other Ambulatory Visit: Payer: Self-pay | Admitting: Obstetrics and Gynecology

## 2020-07-27 DIAGNOSIS — Z1231 Encounter for screening mammogram for malignant neoplasm of breast: Secondary | ICD-10-CM

## 2020-07-28 ENCOUNTER — Other Ambulatory Visit: Payer: Self-pay

## 2020-07-28 ENCOUNTER — Ambulatory Visit
Admission: RE | Admit: 2020-07-28 | Discharge: 2020-07-28 | Disposition: A | Discharge status: Home / Self Care | Payer: Managed Care, Other (non HMO) | Source: Ambulatory Visit | Attending: Obstetrics and Gynecology | Admitting: Obstetrics and Gynecology

## 2020-07-28 DIAGNOSIS — Z1231 Encounter for screening mammogram for malignant neoplasm of breast: Secondary | ICD-10-CM | POA: Diagnosis present

## 2021-04-07 ENCOUNTER — Other Ambulatory Visit: Payer: Self-pay | Admitting: Ophthalmology

## 2021-04-07 DIAGNOSIS — H538 Other visual disturbances: Secondary | ICD-10-CM

## 2021-04-07 DIAGNOSIS — E0500 Thyrotoxicosis with diffuse goiter without thyrotoxic crisis or storm: Secondary | ICD-10-CM

## 2021-04-07 DIAGNOSIS — H02403 Unspecified ptosis of bilateral eyelids: Secondary | ICD-10-CM

## 2021-04-07 DIAGNOSIS — H5789 Other specified disorders of eye and adnexa: Secondary | ICD-10-CM

## 2021-04-21 ENCOUNTER — Other Ambulatory Visit: Payer: Self-pay

## 2021-04-21 ENCOUNTER — Ambulatory Visit
Admission: RE | Admit: 2021-04-21 | Discharge: 2021-04-21 | Disposition: A | Discharge status: Home / Self Care | Payer: Managed Care, Other (non HMO) | Source: Ambulatory Visit | Attending: Ophthalmology | Admitting: Ophthalmology

## 2021-04-21 DIAGNOSIS — E0500 Thyrotoxicosis with diffuse goiter without thyrotoxic crisis or storm: Secondary | ICD-10-CM | POA: Insufficient documentation

## 2021-04-21 DIAGNOSIS — H538 Other visual disturbances: Secondary | ICD-10-CM | POA: Diagnosis present

## 2021-04-21 DIAGNOSIS — H02403 Unspecified ptosis of bilateral eyelids: Secondary | ICD-10-CM | POA: Insufficient documentation

## 2021-04-21 DIAGNOSIS — E079 Disorder of thyroid, unspecified: Secondary | ICD-10-CM

## 2021-04-21 MED ORDER — IOHEXOL 350 MG/ML SOLN
150.0000 mL | Freq: Once | INTRAVENOUS | Status: AC | PRN
  Administered 2021-04-21: 60 mL via INTRAVENOUS

## 2021-05-05 ENCOUNTER — Ambulatory Visit
Admission: EM | Admit: 2021-05-05 | Discharge: 2021-05-05 | Disposition: A | Discharge status: Home / Self Care | Payer: Managed Care, Other (non HMO) | Attending: Internal Medicine | Admitting: Internal Medicine

## 2021-05-05 ENCOUNTER — Other Ambulatory Visit: Payer: Self-pay

## 2021-05-05 DIAGNOSIS — L255 Unspecified contact dermatitis due to plants, except food: Secondary | ICD-10-CM | POA: Diagnosis not present

## 2021-05-05 MED ORDER — HYDROXYZINE HCL 50 MG PO TABS: 50.0000 mg | ORAL_TABLET | Freq: Three times a day (TID) | ORAL | 0 refills | Status: AC | PRN

## 2021-05-05 NOTE — ED Provider Notes (Signed)
[facility]    CSN: [id number] Arrival date & time: 05/05/21  1304      History   Chief Complaint Chief Complaint  Patient presents with   Rash    HPI Abigail West is a 60 y.o. female comes to the urgent care with a 1 week history of pruritic rash over her arms, legs, neck and face.  Patient worked the yard last week and she may have been exposed to poison ivy or poison oak. Patient completed a course of prednisone prescribed a week ago. The rash remain pruritic but is drying up. She was advised to come to the urgent care for re-evaluation and possibly another shot of steroids.   HPI  Past Medical History:  Diagnosis Date   Depression    Hypothyroidism    Stress incontinence    Thyroid disease     Patient Active Problem List   Diagnosis Date Noted   Neurogenic claudication (HCC) 06/27/2018    Past Surgical History:  Procedure Laterality Date   COLONOSCOPY     CYSTOSCOPY N/A 08/21/2017   Procedure: CYSTOSCOPY;  Surgeon: Alfredo Martinez, MD;  Location: [facility];  Service: Urology;  Laterality: N/A;   LUMBAR LAMINECTOMY/DECOMPRESSION MICRODISCECTOMY Right 06/27/2018   Procedure: LUMBAR LAMINECTOMY/DECOMPRESSION MICRODISCECTOMY 1 LEVEL-L4-5;  Surgeon: Venetia Night, MD;  Location: [facility];  Service: Neurosurgery;  Laterality: Right;   PUBOVAGINAL SLING N/A 08/21/2017   Procedure: Leonides Grills;  Surgeon: Alfredo Martinez, MD;  Location: [facility];  Service: Urology;  Laterality: N/A;   THYROIDECTOMY  1989    OB History   No obstetric history on file.      Home Medications    Prior to Admission medications   Medication Sig Start Date End Date Taking? Authorizing Provider  FLUoxetine (PROZAC) 20 MG capsule Take 20 mg by mouth daily. 09/29/16  Yes [provider]  hydrOXYzine (ATARAX/VISTARIL) 50 MG tablet Take 1 tablet (50 mg total) by mouth every 8 (eight) hours as needed. 05/05/21  Yes Jasiah Buntin, Britta Mccreedy, MD  levothyroxine  (SYNTHROID) 100 MCG tablet  04/05/21  Yes [provider]  cetirizine (ZYRTEC) 10 MG tablet Take 10 mg by mouth daily as needed for allergies.  06/18/20  [provider]  gabapentin (NEURONTIN) 100 MG capsule Take 200 mg by mouth at bedtime. 05/01/18 01/25/20  [provider]    Family History Family History  Problem Relation Age of Onset   Parkinson's disease Mother    Dementia Mother    Heart attack Father    Breast cancer Neg Hx     Social History Social History   Tobacco Use   Smoking status: Former    Types: Cigarettes    Quit date: 08/08/2012    Years since quitting: 8.7   Smokeless tobacco: Never  Vaping Use   Vaping Use: Former   Start date: 01/19/2016  Substance Use Topics   Alcohol use: Yes    Alcohol/week: 3.0 standard drinks    Types: 3 Cans of beer per week   Drug use: No     Allergies   Patient has no known allergies.   Review of Systems Review of Systems  Respiratory: Negative.    Gastrointestinal: Negative.   Musculoskeletal: Negative.  Negative for arthralgias.  Skin:  Positive for rash and wound. Negative for color change.  Neurological:  Negative for headaches.    Physical Exam Triage Vital Signs ED Triage Vitals  Enc Vitals Group     BP 05/05/21 1323 130/79  Pulse Rate 05/05/21 1323 61     Resp 05/05/21 1323 18     Temp 05/05/21 1323 (!) 97.5 F (36.4 C)     Temp Source 05/05/21 1323 Oral     SpO2 05/05/21 1323 98 %     Weight 05/05/21 1321 162 lb (73.5 kg)     Height 05/05/21 1321 5\' 4"  (1.626 m)     Head Circumference --      Peak Flow --      Pain Score 05/05/21 1320 0     Pain Loc --      Pain Edu? --      Excl. in GC? --    No data found.  Updated Vital Signs BP 130/79 (BP Location: Left Arm)   Pulse 61   Temp (!) 97.5 F (36.4 C) (Oral)   Resp 18   Ht 5\' 4"  (1.626 m)   Wt 73.5 kg   SpO2 98%   BMI 27.81 kg/m   Visual Acuity Right Eye Distance:   Left Eye Distance:   Bilateral  Distance:    Right Eye Near:   Left Eye Near:    Bilateral Near:     Physical Exam Vitals and nursing note reviewed.  Constitutional:      General: She is not in acute distress.    Appearance: Normal appearance. She is not ill-appearing.  Musculoskeletal:        General: Normal range of motion.  Skin:    Comments: Multiple mildly erythematous rash on the right upper extremity, left upper extremity and neck.  Most of the rash is is found on the right upper extremity.  Rash is faintly erythematous.  Most of the rashes are papular with a few of them being fluid-filled or scabbed over.  Neurological:     Mental Status: She is alert.     UC Treatments / Results  Labs (all labs ordered are listed, but only abnormal results are displayed) Labs Reviewed - No data to display  EKG   Radiology No results found.  Procedures Procedures (including critical care time)  Medications Ordered in UC Medications - No data to display  Initial Impression / Assessment and Plan / UC Course  I have reviewed the triage vital signs and the nursing notes.  Pertinent labs & imaging results that were available during my care of the patient were reviewed by me and considered in my medical decision making (see chart for details).     Rhus dermatitis: Patient just completed a course of steroids A shot of steroids will not help the rash you faster.  The rash is already improving.  I recommended against giving her a steroid injection. Hydroxyzine was prescribed for the patient. Return to urgent care if rash worsens.   Final Clinical Impressions(s) / UC Diagnoses   Final diagnoses:  Rhus dermatitis     Discharge Instructions      I have sent medication for itching to the pharmacy I believe that you have had an adequate dose of steroids for an adequate duration of time The rash is scabbing over so I know that is improving I expect the rash to continue improving in the coming days.   ED  Prescriptions     Medication Sig Dispense Auth. Provider   hydrOXYzine (ATARAX/VISTARIL) 50 MG tablet Take 1 tablet (50 mg total) by mouth every 8 (eight) hours as needed. 30 tablet Cortlin Marano, 05/07/21, MD      PDMP not reviewed this encounter.  Merrilee Jansky, MD 05/05/21 1447

## 2021-05-05 NOTE — Discharge Instructions (Addendum)
I have sent medication for itching to the pharmacy I believe that you have had an adequate dose of steroids for an adequate duration of time The rash is scabbing over so I know that is improving I expect the rash to continue improving in the coming days.

## 2021-05-05 NOTE — ED Triage Notes (Signed)
Pt c/o rash to her arms, legs, neck and face for over a week. Pt was evaluated on a telehealth visit and given methylprednisolone and she has finished this. Pt states areas do itch, denies pain. Pt states she was working in her yard prior to rash starting, is insure of cause.

## 2021-11-11 ENCOUNTER — Other Ambulatory Visit: Payer: Self-pay | Admitting: Obstetrics and Gynecology

## 2021-11-11 ENCOUNTER — Other Ambulatory Visit: Payer: Self-pay | Admitting: Family Medicine

## 2021-11-11 DIAGNOSIS — Z1231 Encounter for screening mammogram for malignant neoplasm of breast: Secondary | ICD-10-CM

## 2021-12-22 ENCOUNTER — Ambulatory Visit: Payer: Managed Care, Other (non HMO)

## 2021-12-23 ENCOUNTER — Ambulatory Visit
Admission: RE | Admit: 2021-12-23 | Discharge: 2021-12-23 | Disposition: A | Discharge status: Home / Self Care | Payer: Managed Care, Other (non HMO) | Source: Ambulatory Visit | Attending: Family Medicine | Admitting: Family Medicine

## 2021-12-23 DIAGNOSIS — Z1231 Encounter for screening mammogram for malignant neoplasm of breast: Secondary | ICD-10-CM | POA: Insufficient documentation

## 2022-06-19 IMAGING — MG MM DIGITAL SCREENING BILAT W/ TOMO AND CAD
8 series · 8 of 24 positions shown · non-contrast
Comparison: Previous exam(s).

CLINICAL DATA: Screening.

EXAM:
DIGITAL SCREENING BILATERAL MAMMOGRAM WITH TOMOSYNTHESIS AND CAD
TECHNIQUE: Bilateral screening digital craniocaudal and mediolateral oblique
mammograms were obtained. Bilateral screening digital breast
tomosynthesis was performed. The images were evaluated with
computer-aided detection.

[L CC synth-2D]
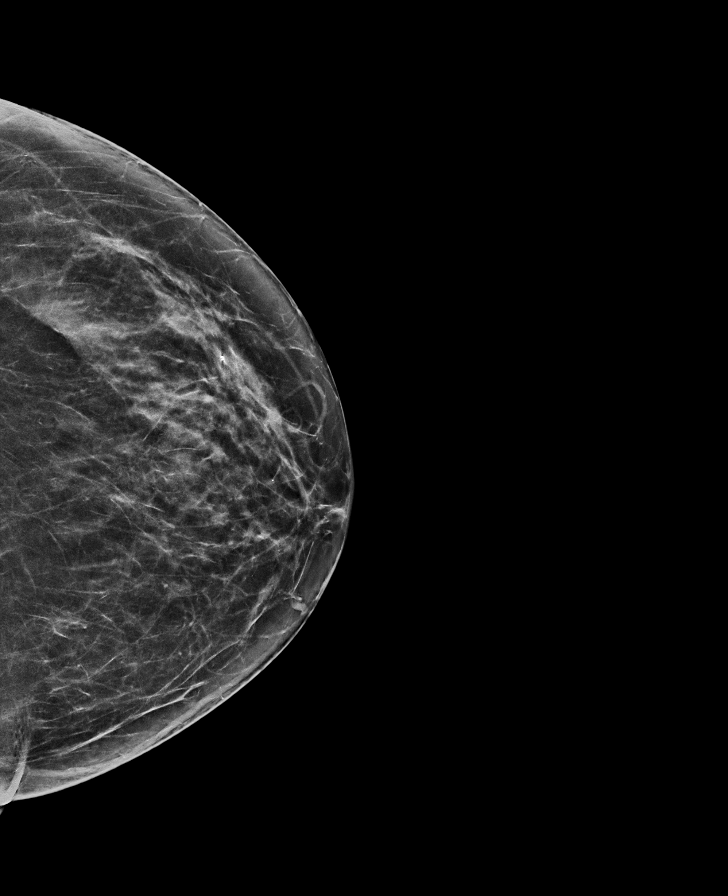

[R MLO synth-2D]
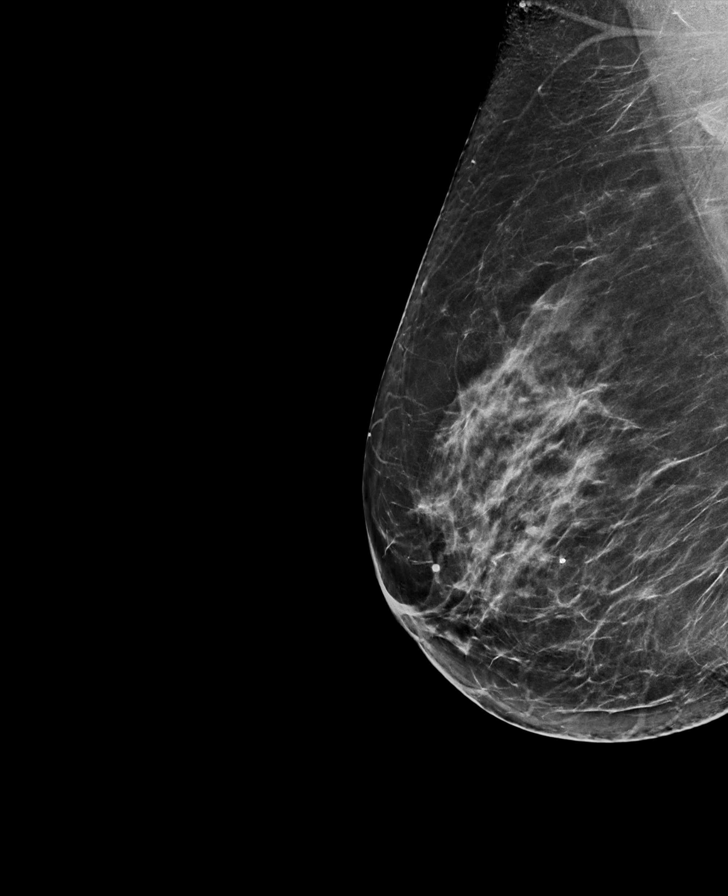

[R CC synth-2D]
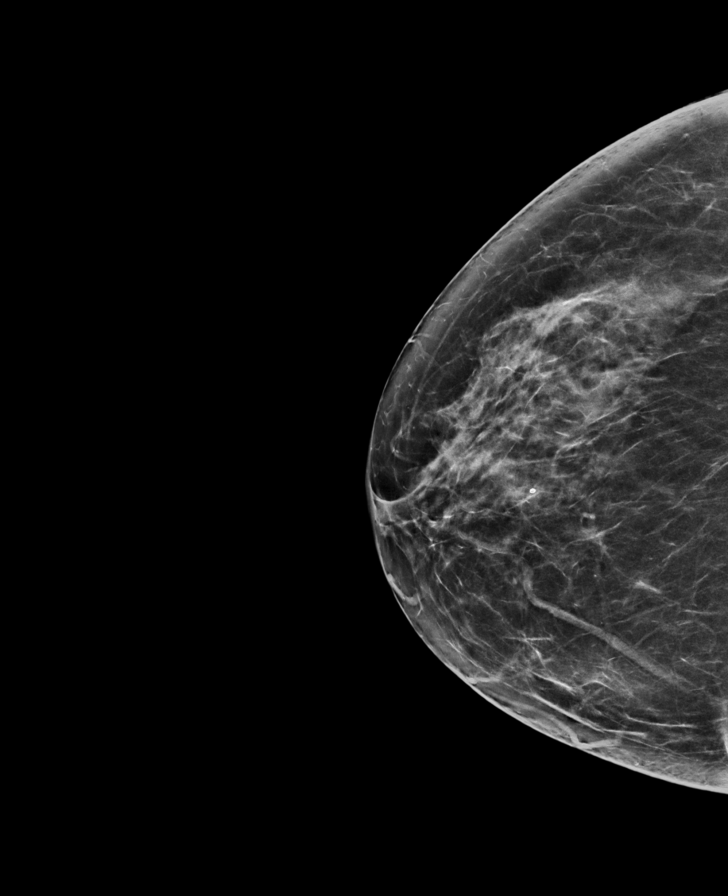

[L MLO synth-2D]
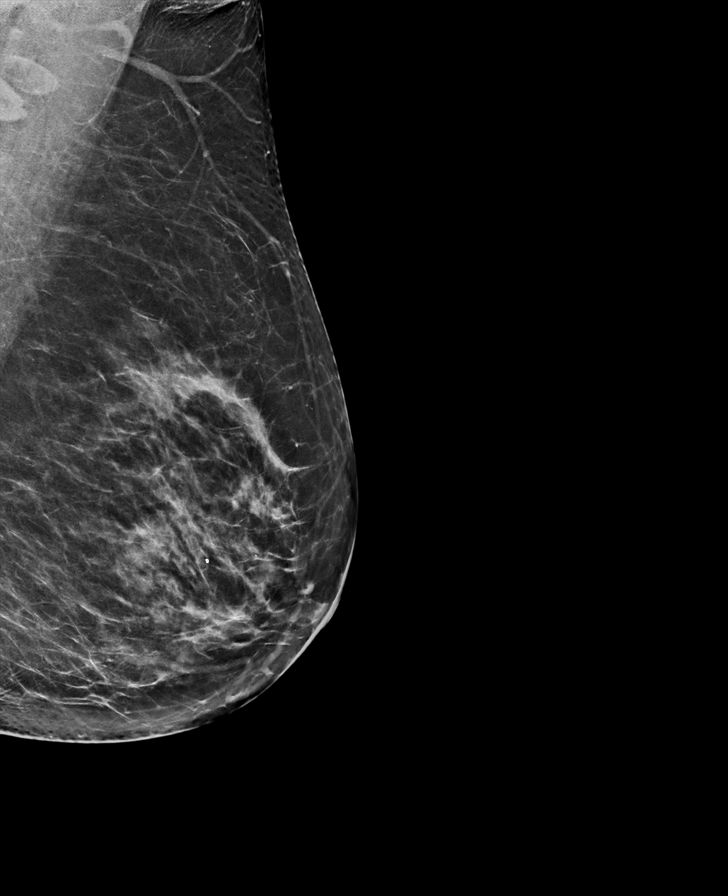

[R CC tomo · tomo slice 37/72.0]
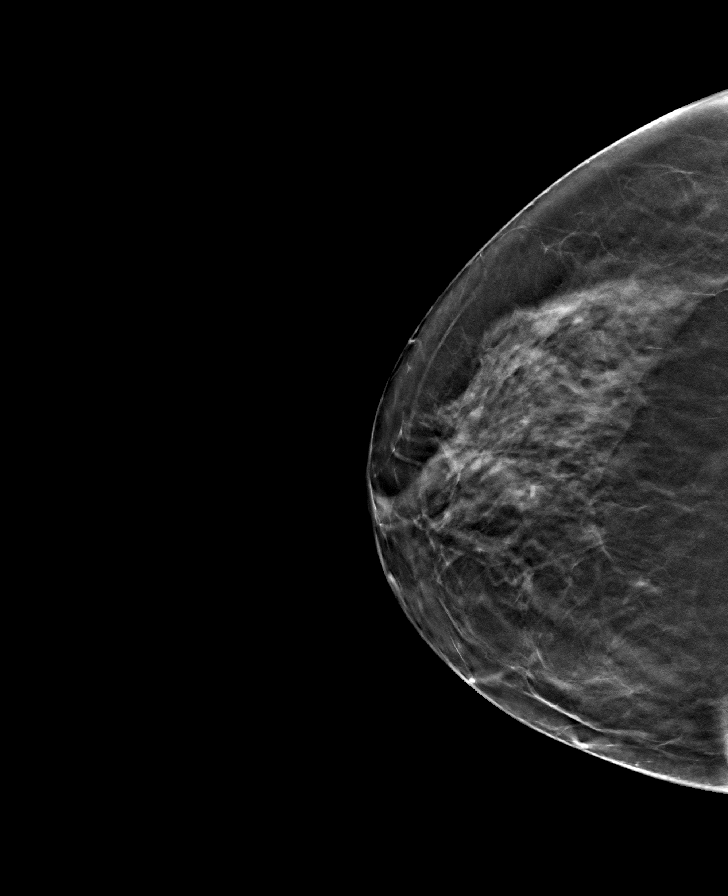

[L CC tomo · tomo slice 35/68.0]
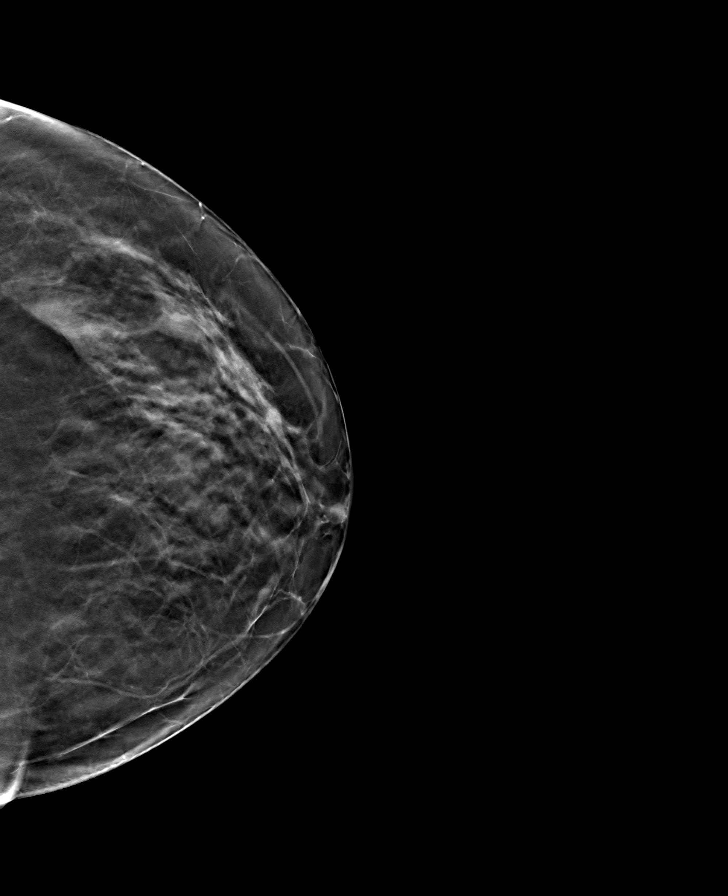

[R MLO tomo · tomo slice 39/78.0]
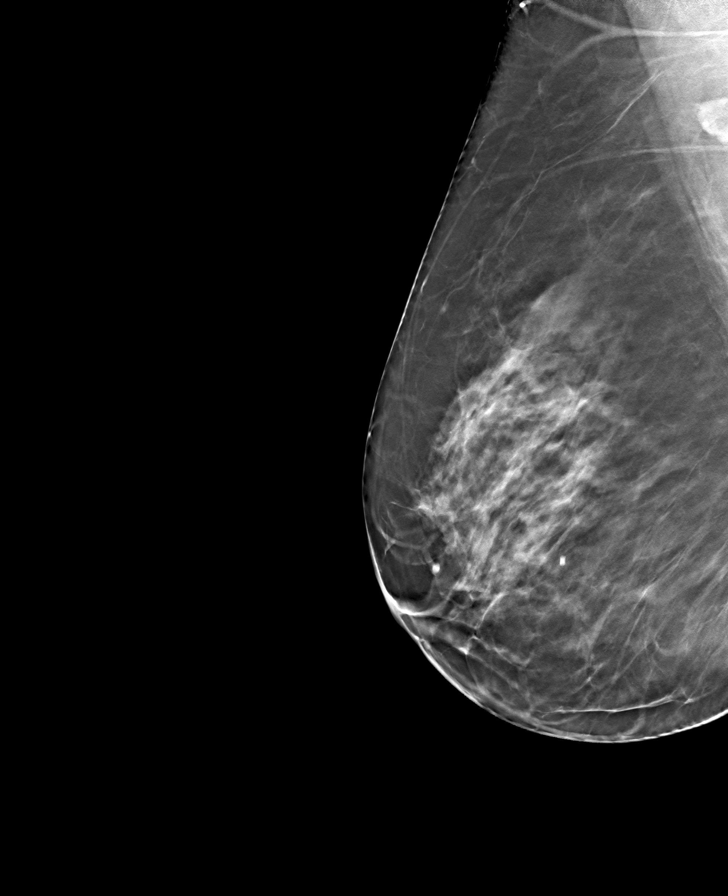

[L MLO tomo · tomo slice 37/74.0]
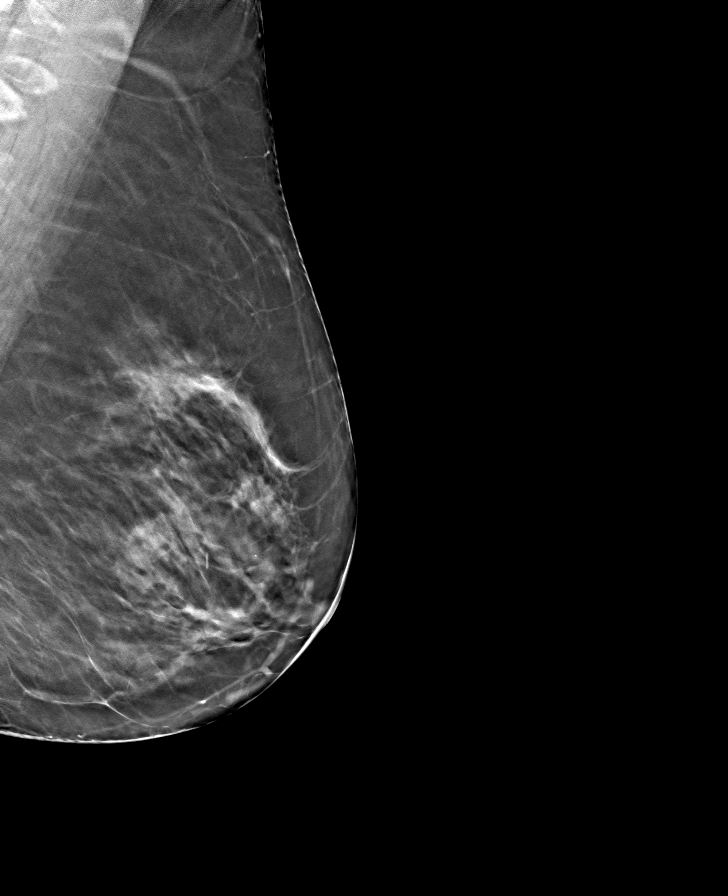

[8 of 24 positions shown; findings below may reference images not displayed]

ACR Breast Density Category c: The breast tissue is heterogeneously
dense, which may obscure small masses.
FINDINGS: There are no findings suspicious for malignancy.
IMPRESSION: No mammographic evidence of malignancy. A result letter of this
screening mammogram will be mailed directly to the patient.

RECOMMENDATION:
Screening mammogram in one year. (Code:[id number])

BI-RADS CATEGORY  1: Negative.

## 2022-12-19 ENCOUNTER — Other Ambulatory Visit: Payer: Self-pay | Admitting: Family Medicine

## 2022-12-19 DIAGNOSIS — Z1231 Encounter for screening mammogram for malignant neoplasm of breast: Secondary | ICD-10-CM

## 2022-12-28 ENCOUNTER — Ambulatory Visit
Admission: RE | Admit: 2022-12-28 | Discharge: 2022-12-28 | Disposition: A | Discharge status: Home / Self Care | Payer: Managed Care, Other (non HMO) | Source: Ambulatory Visit | Attending: Family Medicine | Admitting: Family Medicine

## 2022-12-28 DIAGNOSIS — Z1231 Encounter for screening mammogram for malignant neoplasm of breast: Secondary | ICD-10-CM | POA: Diagnosis not present

## 2023-02-01 ENCOUNTER — Ambulatory Visit
Admission: RE | Admit: 2023-02-01 | Discharge: 2023-02-01 | Disposition: A | Discharge status: Home / Self Care | Payer: Managed Care, Other (non HMO) | Source: Ambulatory Visit | Attending: Physician Assistant | Admitting: Physician Assistant

## 2023-02-01 VITALS — BP 121/80 | HR 72 | Temp 99.1°F | Resp 16

## 2023-02-01 DIAGNOSIS — U071 COVID-19: Secondary | ICD-10-CM

## 2023-02-01 DIAGNOSIS — B349 Viral infection, unspecified: Secondary | ICD-10-CM

## 2023-02-01 DIAGNOSIS — R051 Acute cough: Secondary | ICD-10-CM | POA: Diagnosis not present

## 2023-02-01 DIAGNOSIS — J029 Acute pharyngitis, unspecified: Secondary | ICD-10-CM

## 2023-02-01 LAB — GROUP A STREP BY PCR: Group A Strep by PCR: NOT DETECTED

## 2023-02-01 LAB — SARS CORONAVIRUS 2 BY RT PCR: SARS Coronavirus 2 by RT PCR: POSITIVE — AB

## 2023-02-01 MED ORDER — LIDOCAINE VISCOUS HCL 2 % MT SOLN: 15.0000 mL | OROMUCOSAL | 0 refills | Status: AC | PRN

## 2023-02-01 MED ORDER — PROMETHAZINE-DM 6.25-15 MG/5ML PO SYRP: 5.0000 mL | ORAL_SOLUTION | Freq: Four times a day (QID) | ORAL | 0 refills | Status: AC | PRN

## 2023-02-01 MED ORDER — IPRATROPIUM BROMIDE 0.06 % NA SOLN: 2.0000 | Freq: Four times a day (QID) | NASAL | 0 refills | Status: AC

## 2023-02-01 NOTE — Discharge Instructions (Addendum)
-  Negative strep -COVID obtained. Will call if + -Likely viral illness. Treat symptoms.  URI/COLD SYMPTOMS: Your exam today is consistent with a viral illness. Antibiotics are not indicated at this time. Use medications as directed, including cough syrup, nasal saline, and decongestants. Your symptoms should improve over the next few days and resolve within 7-10 days. Increase rest and fluids. F/u if symptoms worsen or predominate such as sore throat, ear pain, productive cough, shortness of breath, or if you develop high fevers or worsening fatigue over the next several days.

## 2023-02-01 NOTE — ED Triage Notes (Signed)
Pt presents with a sore throat and bodyaches x 2 days.

## 2023-02-01 NOTE — ED Provider Notes (Signed)
[facility]    CSN: [id number] Arrival date & time: 02/01/23  1057      History   Chief Complaint Chief Complaint  Patient presents with   Sore Throat    Entered by patient   Generalized Body Aches    HPI Abigail West is a 62 y.o. female presenting for 2-day history of fatigue, body aches, sore throat, cough, congestion, left-sided ear pain.  Denies fever, chest pain or breathing difficulty, vomiting or diarrhea.  No sick contacts.  Has been taking Alka-Seltzer over-the-counter for symptoms.  No other complaints.  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    Past Surgical History:  Procedure Laterality Date   COLONOSCOPY     CYSTOSCOPY N/A 08/21/2017   Procedure: CYSTOSCOPY;  Surgeon: Alfredo Martinez, MD;  Location: [facility];  Service: Urology;  Laterality: N/A;   LUMBAR LAMINECTOMY/DECOMPRESSION MICRODISCECTOMY Right 06/27/2018   Procedure: LUMBAR LAMINECTOMY/DECOMPRESSION MICRODISCECTOMY 1 LEVEL-L4-5;  Surgeon: Venetia Night, MD;  Location: [facility];  Service: Neurosurgery;  Laterality: Right;   PUBOVAGINAL SLING N/A 08/21/2017   Procedure: Leonides Grills;  Surgeon: Alfredo Martinez, MD;  Location: [facility];  Service: Urology;  Laterality: N/A;   THYROIDECTOMY  1989    OB History   No obstetric history on file.      Home Medications    Prior to Admission medications   Medication Sig Start Date End Date Taking? Authorizing Provider  ipratropium (ATROVENT) 0.06 % nasal spray Place 2 sprays into both nostrils 4 (four) times daily. 02/01/23  Yes Eusebio Friendly B, PA-C  lidocaine (XYLOCAINE) 2 % solution Use as directed 15 mLs in the mouth or throat every 3 (three) hours as needed for mouth pain (swish and spit). 02/01/23  Yes Shirlee Latch, PA-C   promethazine-dextromethorphan (PROMETHAZINE-DM) 6.25-15 MG/5ML syrup Take 5 mLs by mouth 4 (four) times daily as needed. 02/01/23  Yes Shirlee Latch, PA-C  FLUoxetine (PROZAC) 20 MG capsule Take 20 mg by mouth daily. 09/29/16   [provider]  hydrOXYzine (ATARAX/VISTARIL) 50 MG tablet Take 1 tablet (50 mg total) by mouth every 8 (eight) hours as needed. 05/05/21   Merrilee Jansky, MD  levothyroxine (SYNTHROID) 100 MCG tablet  04/05/21   [provider]  cetirizine (ZYRTEC) 10 MG tablet Take 10 mg by mouth daily as needed for allergies.  06/18/20  [provider]  gabapentin (NEURONTIN) 100 MG capsule Take 200 mg by mouth at bedtime. 05/01/18 01/25/20  [provider]    Family History Family History  Problem Relation Age of Onset   Parkinson's disease Mother    Dementia Mother    Heart attack Father    Breast cancer Neg Hx     Social History Social History   Tobacco Use   Smoking status: Former    Types: Cigarettes    Quit date: 08/08/2012    Years since quitting: 10.4   Smokeless tobacco: Never  Vaping Use   Vaping Use: Former   Start date: 01/19/2016  Substance Use Topics   Alcohol use: Yes    Alcohol/week: 3.0 standard drinks of alcohol    Types: 3 Cans of beer per week   Drug use: No     Allergies   Patient has no known allergies.  Review of Systems Review of Systems  Constitutional:  Positive for fatigue. Negative for chills, diaphoresis and fever.  HENT:  Positive for congestion, ear pain, rhinorrhea, sinus pressure and sore throat. Negative for sinus pain.   Respiratory:  Positive for cough. Negative for shortness of breath.   Gastrointestinal:  Negative for abdominal pain, nausea and vomiting.  Musculoskeletal:  Positive for myalgias.  Skin:  Negative for rash.  Neurological:  Negative for weakness and headaches.  Hematological:  Negative for adenopathy.     Physical Exam Triage Vital Signs ED Triage Vitals  Enc  Vitals Group     BP 02/01/23 1106 121/80     Pulse Rate 02/01/23 1106 72     Resp 02/01/23 1106 16     Temp 02/01/23 1106 99.1 F (37.3 C)     Temp Source 02/01/23 1106 Oral     SpO2 02/01/23 1106 98 %     Weight --      Height --      Head Circumference --      Peak Flow --      Pain Score 02/01/23 1105 8     Pain Loc --      Pain Edu? --      Excl. in GC? --    No data found.  Updated Vital Signs BP 121/80 (BP Location: Right Arm)   Pulse 72   Temp 99.1 F (37.3 C) (Oral)   Resp 16   SpO2 98%      Physical Exam Vitals and nursing note reviewed.  Constitutional:      General: She is not in acute distress.    Appearance: Normal appearance. She is ill-appearing. She is not toxic-appearing.  HENT:     Head: Normocephalic and atraumatic.     Right Ear: Tympanic membrane, ear canal and external ear normal.     Left Ear: Tympanic membrane, ear canal and external ear normal.     Nose: Congestion present.     Mouth/Throat:     Mouth: Mucous membranes are moist.     Pharynx: Oropharynx is clear. Posterior oropharyngeal erythema present.  Eyes:     General: No scleral icterus.       Right eye: No discharge.        Left eye: No discharge.     Conjunctiva/sclera: Conjunctivae normal.  Cardiovascular:     Rate and Rhythm: Normal rate and regular rhythm.     Heart sounds: Normal heart sounds.  Pulmonary:     Effort: Pulmonary effort is normal. No respiratory distress.     Breath sounds: Normal breath sounds.  Musculoskeletal:     Cervical back: Neck supple.  Skin:    General: Skin is dry.  Neurological:     General: No focal deficit present.     Mental Status: She is alert. Mental status is at baseline.     Motor: No weakness.     Gait: Gait normal.  Psychiatric:        Mood and Affect: Mood normal.        Behavior: Behavior normal.        Thought Content: Thought content normal.      UC Treatments / Results  Labs (all labs ordered are listed, but only  abnormal results are displayed) Labs Reviewed  SARS CORONAVIRUS 2 BY RT PCR - Abnormal; Notable for the following components:      Result Value   SARS Coronavirus 2 by RT PCR POSITIVE (*)  All other components within normal limits  GROUP A STREP BY PCR    EKG   Radiology No results found.  Procedures Procedures (including critical care time)  Medications Ordered in UC Medications - No data to display  Initial Impression / Assessment and Plan / UC Course  I have reviewed the triage vital signs and the nursing notes.  Pertinent labs & imaging results that were available during my care of the patient were reviewed by me and considered in my medical decision making (see chart for details).   62 year old female presents for 2-day history of fatigue, sore throat, cough, congestion.  Concerned about strep throat.  Taking Alka-Seltzer.  Vitals are normal and stable.  Patient mildly ill-appearing but nontoxic.  On exam is nasal congestion, erythema posterior pharynx.  Chest clear to auscultation.  PCR strep test performed.  Negative.  COVID test obtained advised patient we will contact her if it is positive.  Sent Promethazine DM, viscous lidocaine Atrovent nasal spray to pharmacy for symptoms.  Reviewed supportive care.  COVID test is positive.  Called and discussed results with patient after verifying her birthday.  I did discuss antiviral therapy with her.  She would like to hold off on this for now.  She will take the prescribed medications and rest.  Reviewed current CDC guidelines, isolation protocol and ED precautions.   Final Clinical Impressions(s) / UC Diagnoses   Final diagnoses:  Viral illness  Sore throat  Acute cough  COVID-19     Discharge Instructions      -Negative strep -COVID obtained. Will call if + -Likely viral illness. Treat symptoms.  URI/COLD SYMPTOMS: Your exam today is consistent with a viral illness. Antibiotics are not indicated at this  time. Use medications as directed, including cough syrup, nasal saline, and decongestants. Your symptoms should improve over the next few days and resolve within 7-10 days. Increase rest and fluids. F/u if symptoms worsen or predominate such as sore throat, ear pain, productive cough, shortness of breath, or if you develop high fevers or worsening fatigue over the next several days.       ED Prescriptions     Medication Sig Dispense Auth. Provider   promethazine-dextromethorphan (PROMETHAZINE-DM) 6.25-15 MG/5ML syrup Take 5 mLs by mouth 4 (four) times daily as needed. 118 mL Eusebio Friendly B, PA-C   ipratropium (ATROVENT) 0.06 % nasal spray Place 2 sprays into both nostrils 4 (four) times daily. 15 mL Eusebio Friendly B, PA-C   lidocaine (XYLOCAINE) 2 % solution Use as directed 15 mLs in the mouth or throat every 3 (three) hours as needed for mouth pain (swish and spit). 100 mL Shirlee Latch, PA-C      PDMP not reviewed this encounter.   Shirlee Latch, PA-C 02/01/23 1253

## 2023-12-06 ENCOUNTER — Ambulatory Visit: Payer: Self-pay

## 2023-12-06 DIAGNOSIS — D122 Benign neoplasm of ascending colon: Secondary | ICD-10-CM

## 2023-12-06 DIAGNOSIS — K5730 Diverticulosis of large intestine without perforation or abscess without bleeding: Secondary | ICD-10-CM | POA: Diagnosis not present

## 2023-12-06 DIAGNOSIS — K640 First degree hemorrhoids: Secondary | ICD-10-CM

## 2023-12-06 DIAGNOSIS — D123 Benign neoplasm of transverse colon: Secondary | ICD-10-CM | POA: Diagnosis not present

## 2023-12-06 DIAGNOSIS — Z1211 Encounter for screening for malignant neoplasm of colon: Secondary | ICD-10-CM

## 2024-02-26 ENCOUNTER — Other Ambulatory Visit: Payer: Self-pay | Admitting: Family Medicine

## 2024-02-26 DIAGNOSIS — Z1231 Encounter for screening mammogram for malignant neoplasm of breast: Secondary | ICD-10-CM

## 2024-03-28 ENCOUNTER — Ambulatory Visit
Admission: RE | Admit: 2024-03-28 | Discharge: 2024-03-28 | Disposition: A | Discharge status: Home / Self Care | Source: Ambulatory Visit | Attending: Family Medicine | Admitting: Family Medicine

## 2024-03-28 DIAGNOSIS — Z1231 Encounter for screening mammogram for malignant neoplasm of breast: Secondary | ICD-10-CM | POA: Insufficient documentation
